# Patient Record
Sex: Male | Born: 1975
Health system: Southern US, Community
[De-identification: ages and names within clinical notes are randomized; demographics above are authoritative.]

## PROBLEM LIST (undated history)

## (undated) DIAGNOSIS — R000 Tachycardia, unspecified: Secondary | ICD-10-CM

## (undated) DIAGNOSIS — I422 Other hypertrophic cardiomyopathy: Secondary | ICD-10-CM

## (undated) DIAGNOSIS — I10 Essential (primary) hypertension: Secondary | ICD-10-CM

## (undated) DIAGNOSIS — I471 Supraventricular tachycardia, unspecified: Secondary | ICD-10-CM

## (undated) HISTORY — DX: Other hypertrophic cardiomyopathy: I42.2

## (undated) HISTORY — DX: Tachycardia, unspecified: R00.0

## (undated) HISTORY — PX: FINGER SURGERY: SHX640

---

## 2008-10-15 ENCOUNTER — Encounter: Payer: Self-pay | Admitting: Internal Medicine

## 2008-10-15 ENCOUNTER — Inpatient Hospital Stay (HOSPITAL_COMMUNITY): Admission: EM | Admit: 2008-10-15 | Discharge: 2008-10-17 | Payer: Self-pay | Admitting: Emergency Medicine

## 2008-10-16 ENCOUNTER — Encounter (INDEPENDENT_AMBULATORY_CARE_PROVIDER_SITE_OTHER): Payer: Self-pay | Admitting: Cardiology

## 2008-10-17 ENCOUNTER — Encounter (INDEPENDENT_AMBULATORY_CARE_PROVIDER_SITE_OTHER): Payer: Self-pay | Admitting: Cardiology

## 2008-10-17 ENCOUNTER — Encounter: Payer: Self-pay | Admitting: Internal Medicine

## 2008-10-17 ENCOUNTER — Encounter: Payer: Self-pay | Admitting: Cardiology

## 2009-11-30 DIAGNOSIS — Z9189 Other specified personal risk factors, not elsewhere classified: Secondary | ICD-10-CM | POA: Insufficient documentation

## 2009-11-30 DIAGNOSIS — E7800 Pure hypercholesterolemia, unspecified: Secondary | ICD-10-CM

## 2009-11-30 DIAGNOSIS — Z8679 Personal history of other diseases of the circulatory system: Secondary | ICD-10-CM

## 2009-11-30 DIAGNOSIS — I429 Cardiomyopathy, unspecified: Secondary | ICD-10-CM | POA: Insufficient documentation

## 2009-12-01 ENCOUNTER — Ambulatory Visit: Payer: Self-pay | Admitting: Internal Medicine

## 2009-12-07 ENCOUNTER — Encounter: Payer: Self-pay | Admitting: Internal Medicine

## 2009-12-15 ENCOUNTER — Ambulatory Visit: Payer: Self-pay | Admitting: Internal Medicine

## 2009-12-15 LAB — CONVERTED CEMR LAB
BUN: 11 mg/dL (ref 6–23)
Basophils Absolute: 0 10*3/uL (ref 0.0–0.1)
Eosinophils Absolute: 0.1 10*3/uL (ref 0.0–0.7)
Eosinophils Relative: 1.3 % (ref 0.0–5.0)
GFR calc non Af Amer: 90.84 mL/min (ref >60)
Glucose, Bld: 97 mg/dL (ref 70–99)
INR: 1 (ref 0.8–1.0)
Lymphocytes Relative: 37 % (ref 12.0–46.0)
Monocytes Relative: 7.3 % (ref 3.0–12.0)
Neutro Abs: 3.2 10*3/uL (ref 1.4–7.7)
Platelets: 288 10*3/uL (ref 150.0–400.0)
Potassium: 4.2 meq/L (ref 3.5–5.1)
RDW: 11.3 % — ABNORMAL LOW (ref 11.5–14.6)
Sodium: 140 meq/L (ref 135–145)
WBC: 5.9 10*3/uL (ref 4.5–10.5)

## 2009-12-22 ENCOUNTER — Ambulatory Visit: Payer: Self-pay | Admitting: Internal Medicine

## 2009-12-22 ENCOUNTER — Ambulatory Visit (HOSPITAL_COMMUNITY): Admission: RE | Admit: 2009-12-22 | Discharge: 2009-12-22 | Payer: Self-pay | Admitting: Internal Medicine

## 2009-12-26 ENCOUNTER — Telehealth: Payer: Self-pay | Admitting: Internal Medicine

## 2009-12-26 ENCOUNTER — Ambulatory Visit: Payer: Self-pay | Admitting: Internal Medicine

## 2009-12-26 ENCOUNTER — Inpatient Hospital Stay (HOSPITAL_COMMUNITY): Admission: EM | Admit: 2009-12-26 | Discharge: 2009-12-28 | Payer: Self-pay | Admitting: Emergency Medicine

## 2009-12-27 ENCOUNTER — Encounter: Payer: Self-pay | Admitting: Internal Medicine

## 2009-12-28 ENCOUNTER — Encounter: Payer: Self-pay | Admitting: Internal Medicine

## 2010-01-02 ENCOUNTER — Encounter: Payer: Self-pay | Admitting: Internal Medicine

## 2010-01-02 ENCOUNTER — Telehealth: Payer: Self-pay | Admitting: Internal Medicine

## 2010-01-04 ENCOUNTER — Telehealth (INDEPENDENT_AMBULATORY_CARE_PROVIDER_SITE_OTHER): Payer: Self-pay | Admitting: *Deleted

## 2010-01-10 ENCOUNTER — Ambulatory Visit: Payer: Self-pay | Admitting: Internal Medicine

## 2010-03-26 ENCOUNTER — Encounter: Admission: RE | Admit: 2010-03-26 | Discharge: 2010-03-26 | Payer: Self-pay | Admitting: Rheumatology

## 2010-04-03 ENCOUNTER — Telehealth: Payer: Self-pay | Admitting: Internal Medicine

## 2010-04-20 ENCOUNTER — Ambulatory Visit: Payer: Self-pay | Admitting: Internal Medicine

## 2010-05-25 ENCOUNTER — Telehealth: Payer: Self-pay | Admitting: Internal Medicine

## 2010-06-20 ENCOUNTER — Ambulatory Visit: Payer: Self-pay | Admitting: Internal Medicine

## 2010-07-09 ENCOUNTER — Emergency Department (HOSPITAL_COMMUNITY): Admission: EM | Admit: 2010-07-09 | Discharge: 2010-07-09 | Payer: Self-pay | Admitting: Emergency Medicine

## 2010-07-17 ENCOUNTER — Encounter (HOSPITAL_COMMUNITY): Admission: RE | Admit: 2010-07-17 | Discharge: 2010-07-27 | Payer: Self-pay | Admitting: Cardiology

## 2010-11-20 NOTE — Progress Notes (Signed)
Summary: chest burning/heart racing-GT  Phone Note Call from Patient Call back at Home Phone [phone]   Caller: Patient Reason for Call: Talk to Nurse Summary of Call: Gets tight burning sensation with activity in his chest..... ongoing for a month or so, last for a minute or two after he stops. Had it last night and then his heart started racing, request call back  Initial call taken by: Migdalia Dk,  April 03, 2010 1:23 PM  Follow-up for Phone Call        has episodes of burning in chest and pressure when he does any physical activity last only a few seconds and goes away when he stops this is followed hours later wih herat racing.  Goes away a lot quicker than before  Last stress test 12/27//2009 per patient Dr Rush Barer, RN, BSN  April 03, 2010 3:22 P  pt returning call [phone] Follow-up by: Glynda Jaeger,  April 12, 2010 2:20 PM  Additional Follow-up for Phone Call Additional follow up Details #1::        If happening more thanonce in a 48 hour period wear a 48 hour holter monitor if less frequent will wait  If symptoms more with exertion will get regular GXT Additional Follow-up by: Laren Boom, MD, [facility],  April 11, 2010 5:49 PM    Additional Follow-up for Phone Call Additional follow up Details #2::    [facility] for pt to call me back  Dennis Bast, RN, BSN  April 12, 2010 2:10 PM  returning call [phone], Migdalia Dk  April 16, 2010 1:32 PM  pt aware and will get monitor after Dr Ladona Ridgel reviews monitor will decide about GXT Dennis Bast, RN, BSN  April 16, 2010 4:40 PM

## 2010-11-20 NOTE — Assessment & Plan Note (Signed)
Summary: nep/SVT/jml   History of Present Illness: Jake Huff is referred today for evaluation of SVT.  He is a pleasant 35 yo man with a h/o tachypalpitations for several months.  He did not have symptoms as a child or as a teenager. He notes that his episodes start and stop suddenly and have been terminated with adenosine.  He denies syncope but does get dizzy when he experiences SVT.  He has been treated with multiple medications but was intolerant to beta blockers. He has continued to have symptoms on calcium channel blockers.  Current Medications (verified): 1)  Cartia Xt 180 Mg Xr24h-Cap (Diltiazem Hcl Coated Beads) .... Take One Tablet By Mouth Once Daily. 2)  Xanax 0.25 Mg Tabs (Alprazolam) .... Take One Tablet By Mouth Once Daily. 3)  Aspirin 81 Mg Tbec (Aspirin) .... Take One Tablet By Mouth Daily 4)  Fish Oil   Oil (Fish Oil) .... As Needed 5)  Prilosec Otc 20 Mg Tbec (Omeprazole Magnesium) .... As Needed 6)  Multivitamins   Tabs (Multiple Vitamin) .... As Needed  Allergies: 1)  ! * Penicillin  Past History:  Past Medical History: Last updated: 11/30/2009  1. Status post supraventricular tachycardia.   2. Status post atypical chest pain, negative stress Myoview.   3. Glucose intolerance.   4. Hypercholesteremia.   5. History of tobacco abuse.   6. Alcohol abuse.   Family History: Last updated: 11/30/2009 Father:  is alive, he has coronary artery disease Mother:  in good health Siblings:  One brother in good health. Siblings:  One sister has palpitation. Grandfather died of sudden cardiac death in his 34s.   Social History: Last updated: 11/30/2009 Married ,has one child Tobacco Use - Yes.  Alcohol Use - yes Drug Use - no  Review of Systems       All systems reviewed and negative except as noted in the HPI.  Vital Signs:  Patient profile:   35 year old male Height:      74 inches Weight:      194 pounds BMI:     25.00 Pulse rate:   75 / minute BP  sitting:   130 / 82  (left arm)  Vitals Entered By: Laurance Flatten CMA (December 01, 2009 1:43 PM)  Physical Exam  General:  Well developed, well nourished, in no acute distress.  HEENT: normal Neck: supple. No JVD. Carotids 2+ bilaterally no bruits Cor: RRR no rubs, gallops or murmur Lungs: CTA Ab: soft, nontender. nondistended. No HSM. Good bowel sounds Ext: warm. no cyanosis, clubbing or edema Neuro: alert and oriented. Grossly nonfocal. affect pleasant    EKG  Procedure date:  12/01/2009  Findings:      Normal sinus rhythm with rate of:  No pre-excitation.  Impression & Recommendations:  Problem # 1:  SUPRAVENTRICULAR TACHYCARDIA, HX OF (ICD-V12.59) I have discussed the treatment options with the patient. The risks/benfits/goals and expectations of EPS/RFA have been discussed and he will call to schedule ablation. His updated medication list for this problem includes:    Cartia Xt 180 Mg Xr24h-cap (Diltiazem hcl coated beads) .Marland Kitchen... Take one tablet by mouth once daily.    Aspirin 81 Mg Tbec (Aspirin) .Marland Kitchen... Take one tablet by mouth daily  Orders: EKG w/ Interpretation (93000)  Problem # 2:  HYPERCHOLESTEROLEMIA (ICD-272.0) He is instructed to maintain a low sodium diet.

## 2010-11-20 NOTE — Letter (Signed)
Summary: Work [employer], Main Office  [address]   [place], [place] [address]   Phone: [phone]  Fax: [phone]        Today's Date: January 02, 2010    Name of Patient: Jake Huff #[id number]    The above named patient has been out of work and may return January 03, 2010  Please take this into consideration when reviewing the time away from work.   Special Instructions:  [ x ] None     Sincerely yours,   Charolotte Capuchin, RN for  Dr. Lewayne Bunting

## 2010-11-20 NOTE — Letter (Signed)
Summary: [employer] Authorization Notification   [employer] Authorization Notification   Imported By: Roderic Ovens 03/12/2010 15:54:10  _____________________________________________________________________  External Attachment:    Type:   Image     Comment:   External Document

## 2010-11-20 NOTE — Progress Notes (Signed)
Summary: Records request from [facility]  Request for records received from [place]. Request forwarded to [employer]. Wilder Glade  January 04, 2010 2:59 PM

## 2010-11-20 NOTE — Progress Notes (Signed)
Summary: results from heart monitor  Phone Note Call from Patient   Caller: Patient Reason for Call: Talk to Nurse, Lab or Test Results Summary of Call: pt turned in heart monitor last month and hasn't gotten results-pls call [phone] Initial call taken by: Glynda Jaeger,  May 25, 2010 11:20 AM  Follow-up for Phone Call        aware of results.  Will ask Dr Ladona Ridgel if there is anything else we can do.  He says it  has gotten better Discussed with Dr Ladona Ridgel he will see him in office to discuss what is going on with his pains and decide what to do from here Dennis Bast, RN, BSN  June 13, 2010 9:23 AM

## 2010-11-20 NOTE — Assessment & Plan Note (Signed)
Summary: chest discomfort/mt   Visit Type:  Follow-up Primary Naika Noto:  None   History of Present Illness: Mr. Ellen returns today for followup.  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  Post procedure he developed sustained junctional rhythm/tachycardia at rates from 80-120/min.  This eventually resolved.  Since I las saw him he has noted periods of exertional chest discomfort.  He thinks that his heart still beats fast though not like it was prior to ablation.  No syncope. No peripheral edema.  Current Medications (verified): 1)  Xanax 0.25 Mg Tabs (Alprazolam) .... Take One Tablet By Mouth Once Daily. 2)  Aspirin 81 Mg Tbec (Aspirin) .... Take One Tablet By Mouth Daily 3)  Fish Oil   Oil (Fish Oil) .... As Needed 4)  Prilosec Otc 20 Mg Tbec (Omeprazole Magnesium) .... As Needed 5)  Multivitamins   Tabs (Multiple Vitamin) .... As Needed  Allergies: 1)  ! * Penicillin  Past History:  Past Medical History: Last updated: 11/30/2009  1. Status post supraventricular tachycardia.   2. Status post atypical chest pain, negative stress Myoview.   3. Glucose intolerance.   4. Hypercholesteremia.   5. History of tobacco abuse.   6. Alcohol abuse.   Review of Systems       The patient complains of chest pain.  The patient denies syncope, dyspnea on exertion, and peripheral edema.    Vital Signs:  Patient profile:   35 year old male Height:      74 inches Weight:      194 pounds Pulse rate:   73 / minute BP sitting:   128 / 80  (left arm)  Vitals Entered By: Laurance Flatten CMA (June 20, 2010 10:41 AM)  Physical Exam  General:  Well developed, well nourished, in no acute distress.  HEENT: normal Neck: supple. No JVD. Carotids 2+ bilaterally no bruits Cor: RRR no rubs, gallops or murmur Lungs: CTA Ab: soft, nontender. nondistended. No HSM. Good bowel sounds Ext: warm. no cyanosis, clubbing or  edema Neuro: alert and oriented. Grossly nonfocal. affect pleasant    Impression & Recommendations:  Problem # 1:  SUPRAVENTRICULAR TACHYCARDIA, HX OF (ICD-V12.59) No evidence of recurrent SVT.  I have asked him to continue a period of watchful waiting.  His 48 hour holter showed no recurrent SVT. The following medications were removed from the medication list:    Metoprolol Tartrate 25 Mg Tabs (Metoprolol tartrate) .Marland Kitchen... Take one tablet by mouth twice a day His updated medication list for this problem includes:    Aspirin 81 Mg Tbec (Aspirin) .Marland Kitchen... Take one tablet by mouth daily  Problem # 2:  CHEST PAIN, ATYPICAL, HX OF (ICD-V15.89) The etiology is unclear but he is improved.  I have asked him to call if his symptoms worsen.  Patient Instructions: 1)  Your physician recommends that you schedule a follow-up appointment in: 6 months with Dr Ladona Ridgel

## 2010-11-20 NOTE — Progress Notes (Signed)
Summary: fluttering   Phone Note Call from Patient Call back at [facility] Phone [phone]   Caller: Patient Reason for Call: Talk to Nurse Details for Reason: Per pt calling, c/o fluttering consistence. no cp sob.  Initial call taken by: Lorne Skeens,  December 26, 2009 2:53 PM  Follow-up for Phone Call        spoke with pt, since discharge from the hosp, he has had off and on fluttering in his chest. he also c/o extreme fatique. he denies cp, SOB but states he felt alittle faint at the grocery store but feels fine since resting. will discuss with dr Tonianne Fine Deliah Goody, RN  December 26, 2009 3:11 PM  Follow-up by: Deliah Goody, RN,  December 26, 2009 3:09 PM  Additional Follow-up for Phone Call Additional follow up Details #1::        discussed with dr Armandina Iman, when called pt back he states he is [place] for eval. Deliah Goody, RN  December 26, 2009 4:55 PM

## 2010-11-20 NOTE — Letter (Signed)
Summary: ELectrophysiology/Ablation Procedure Instructions  [employer], Main Office  [address]   [place], [place] [address]   Phone: [phone]  Fax: [phone]     Electrophysiology/Ablation Procedure Instructions    You are scheduled for a(n) SVT ablation on 12/22/09 at 7:30am with Dr. Ladona Ridgel  1.  Please come to the Short Stay Center at [facility] at 5:45am on the day of your procedure.  2.  Come prepared to stay overnight.   Please bring your insurance cards and a list of your medications.  3.  Come to the [place] office on 12/15/09 at 10am for lab work.  The lab at [facility] is open from 8:30 AM to 1:30 PM and 2:30 PM to 5:00 PM.  You do not have to be fasting.  4.  Do not have anything to eat or drink after midnight the night before your procedure.  5.   All of your remaining medications may be taken with a small amount of water.  6.  Educational material received:     _____ Ablation   * Occasionally, EP studies and ablations can become lengthy.  Please make your family aware of this before your procedure starts.  Average time ranges from 2-8 hours for EP studies/ablations.  Your physician will locate your family after the procedure with the results.  * If you have any questions after you get home, please call the office at [phone].  Anselm Pancoast

## 2010-11-20 NOTE — Assessment & Plan Note (Signed)
Summary: eph/post ablation   CC:  pt states he is doing alot better but he still has an uncomfortable feeling in chest once an a while.  History of Present Illness: Mr. Soth returns today for followup.  He is a pleasant 35 yo man with a h/o medically refractory SVT who underwent EPS/RFA several weeks ago of AVNRT.  Post procedure he developed sustained junctional rhythm/tachycardia at rates from 80-120/min.  He was initially hospitalized and his symptoms eventually improved.  He has had no significant symptoms over the past week.  No c/p or sob.  Current Medications (verified): 1)  Xanax 0.25 Mg Tabs (Alprazolam) .... Take One Tablet By Mouth Once Daily. 2)  Aspirin 81 Mg Tbec (Aspirin) .... Take One Tablet By Mouth Daily 3)  Fish Oil   Oil (Fish Oil) .... As Needed 4)  Prilosec Otc 20 Mg Tbec (Omeprazole Magnesium) .... As Needed 5)  Multivitamins   Tabs (Multiple Vitamin) .... As Needed 6)  Metoprolol Tartrate 25 Mg Tabs (Metoprolol Tartrate) .... Take One Tablet By Mouth Twice A Day  Allergies: 1)  ! * Penicillin  Past History:  Past Medical History: Last updated: 11/30/2009  1. Status post supraventricular tachycardia.   2. Status post atypical chest pain, negative stress Myoview.   3. Glucose intolerance.   4. Hypercholesteremia.   5. History of tobacco abuse.   6. Alcohol abuse.   Review of Systems  The patient denies chest pain, syncope, dyspnea on exertion, and peripheral edema.    Vital Signs:  Patient profile:   35 year old male Height:      74 inches Weight:      198 pounds BMI:     25.51 Pulse rate:   80 / minute Resp:     12 per minute BP sitting:   111 / 75  (left arm)  Vitals Entered By: Kem Parkinson (January 10, 2010 10:43 AM)  Physical Exam  General:  Well developed, well nourished, in no acute distress.  HEENT: normal Neck: supple. No JVD. Carotids 2+ bilaterally no bruits Cor: RRR no rubs, gallops or murmur Lungs: CTA Ab: soft, nontender.  nondistended. No HSM. Good bowel sounds Ext: warm. no cyanosis, clubbing or edema Neuro: alert and oriented. Grossly nonfocal. affect pleasant    EKG  Procedure date:  01/10/2010  Findings:      Normal sinus rhythm with rate of:    Impression & Recommendations:  Problem # 1:  SUPRAVENTRICULAR TACHYCARDIA, HX OF (ICD-V12.59) He is now doing well after his junctional rhythm has settled down.  At this point it is unclear why he had these symptoms.  I will see him back as needed. I have asked him to stop his metoprolol. The following medications were removed from the medication list:    Cartia Xt 180 Mg Xr24h-cap (Diltiazem hcl coated beads) .Marland Kitchen... Take one tablet by mouth once daily. His updated medication list for this problem includes:    Aspirin 81 Mg Tbec (Aspirin) .Marland Kitchen... Take one tablet by mouth daily    Metoprolol Tartrate 25 Mg Tabs (Metoprolol tartrate) .Marland Kitchen... Take one tablet by mouth twice a day  Problem # 2:  HYPERCHOLESTEROLEMIA (ICD-272.0)

## 2010-11-20 NOTE — Progress Notes (Signed)
Summary: pt needs note to rtn to work  ASK DR GT  Phone Note Call from Patient Call back at [facility] Phone [phone]   Caller: Patient Reason for Call: Talk to Nurse, Talk to Doctor Summary of Call: pt needs the date on his note to return to work changed cause he didn't feel good and he didn't go to work yesterday and today. You can fax it to [id number] claim # [id number]. please make rtn date 01/03/2010 Initial call taken by: Omer Jack,  January 02, 2010 9:21 AM  Follow-up for Phone Call        [facility] per dr Ladona Ridgel.  Letter will be faxed as requested Follow-up by: Charolotte Capuchin, RN,  January 02, 2010 12:20 PM

## 2010-11-20 NOTE — Letter (Signed)
Summary: FMLA Papers  FMLA Papers   Imported By: Kassie Mends 03/05/2010 09:12:11  _____________________________________________________________________  External Attachment:    Type:   Image     Comment:   External Document

## 2011-01-03 LAB — LIPID PANEL
Cholesterol: 189 mg/dL (ref 0–200)
LDL Cholesterol: 106 mg/dL — ABNORMAL HIGH (ref 0–99)
Total CHOL/HDL Ratio: 5 RATIO
Triglycerides: 226 mg/dL — ABNORMAL HIGH (ref <150)
VLDL: 45 mg/dL — ABNORMAL HIGH (ref 0–40)

## 2011-01-03 LAB — COMPREHENSIVE METABOLIC PANEL
AST: 26 U/L (ref 0–37)
Albumin: 4.4 g/dL (ref 3.5–5.2)
BUN: 8 mg/dL (ref 6–23)
Calcium: 9.7 mg/dL (ref 8.4–10.5)
Chloride: 104 mEq/L (ref 96–112)
Creatinine, Ser: 0.9 mg/dL (ref 0.4–1.5)
GFR calc non Af Amer: >60 mL/min (ref >60)
Total Protein: 8.1 g/dL (ref 6.0–8.3)

## 2011-01-03 LAB — HEPATIC FUNCTION PANEL
AST: 24 U/L (ref 0–37)
Albumin: 3.9 g/dL (ref 3.5–5.2)
Bilirubin, Direct: 0.3 mg/dL (ref 0.0–0.3)
Indirect Bilirubin: 0.6 mg/dL (ref 0.3–0.9)

## 2011-01-03 LAB — BASIC METABOLIC PANEL
Calcium: 9.2 mg/dL (ref 8.4–10.5)
GFR calc Af Amer: >60 mL/min (ref >60)
GFR calc non Af Amer: >60 mL/min (ref >60)
Glucose, Bld: 93 mg/dL (ref 70–99)

## 2011-01-03 LAB — DIFFERENTIAL
Basophils Absolute: 0 10*3/uL (ref 0.0–0.1)
Basophils Relative: 0 % (ref 0–1)
Eosinophils Relative: 0 % (ref 0–5)

## 2011-01-03 LAB — POCT CARDIAC MARKERS
Myoglobin, poc: 68.2 ng/mL (ref 12–200)
Troponin i, poc: <0.05 ng/mL (ref 0.00–0.09)

## 2011-01-03 LAB — CBC
MCH: 31.5 pg (ref 26.0–34.0)
MCHC: 34.7 g/dL (ref 30.0–36.0)
Platelets: 281 10*3/uL (ref 150–400)

## 2011-01-03 LAB — PROTIME-INR: INR: 0.94 (ref 0.00–1.49)

## 2011-01-14 LAB — CARDIAC PANEL(CRET KIN+CKTOT+MB+TROPI)
CK, MB: 0.6 ng/mL (ref 0.3–4.0)
Relative Index: INVALID (ref 0.0–2.5)
Relative Index: INVALID (ref 0.0–2.5)
Total CK: 51 U/L (ref 7–232)
Troponin I: 0.02 ng/mL (ref 0.00–0.06)

## 2011-01-14 LAB — LIPID PANEL
Cholesterol: 167 mg/dL (ref 0–200)
Total CHOL/HDL Ratio: 5.1 RATIO

## 2011-01-14 LAB — BRAIN NATRIURETIC PEPTIDE: Pro B Natriuretic peptide (BNP): 64 pg/mL (ref 0.0–100.0)

## 2011-01-14 LAB — POCT I-STAT, CHEM 8
BUN: 13 mg/dL (ref 6–23)
HCT: 53 % — ABNORMAL HIGH (ref 39.0–52.0)

## 2011-01-14 LAB — MAGNESIUM: Magnesium: 2.2 mg/dL (ref 1.5–2.5)

## 2011-03-05 NOTE — Assessment & Plan Note (Signed)
[facility]                                 ON-CALL NOTE   HIKEEM, ANDERSSON                        MRN:          [id number]  DATE:12/24/2009                            DOB:          April 05, 1976    The phone conversation was December 24, 2009.  Mr. Monks had the on-call PA  paged due to experiencing heart fluttering after having radiofrequency  ablation for supraventricular tachycardia on December 22, 2009.  The patient  was discharged from the hospital that same day and shortly thereafter  began to experience unpleasant heart fluttering that he was concerned  might represent a recurrence of his SVT.  I informed the patient that I  would try to move up his follow up appointment with Dr. Ladona Ridgel that is  already scheduled for January 04, 2010, and the patient was agreeable to  this.  I followed up with the phone conversation on Sunday December 25, 2009, and the patient indicated that his fluttering had been less severe  but was still interested in moving up his appointment if possible but if  not, he was okay with the appointment on the 17th.  I will attempt to  move up this appointment pending Dr. Lubertha Basque availability.     Jarrett Ables, [facility]     MS/MedQ  DD: 12/24/2009  DT: 12/25/2009  Job #: [id number]

## 2011-03-05 NOTE — Discharge Summary (Signed)
NAMEXERXES, AGRUSA               ACCOUNT NO.:  [id number]   MEDICAL RECORD NO.:  [id number]          PATIENT TYPE:  INP   LOCATION:  3701                         FACILITY:  [facility]   PHYSICIAN:  Eduardo Osier. Sharyn Lull, M.D. DATE OF BIRTH:  08-31-1976   DATE OF ADMISSION:  10/16/2008  DATE OF DISCHARGE:  10/17/2008                               DISCHARGE SUMMARY   ADMITTING DIAGNOSES:  1. Status post recurrent supraventricular tachycardia.  2. Atypical chest pain, rule out myocardial infarction.  3. Elevated blood sugar, rule out diabetes mellitus.  4. Tobacco abuse.  5. Alcohol abuse.  6. Ankylosing spondylosis.  7. History of iritis in the past.   DISCHARGE DIAGNOSES:  1. Status post supraventricular tachycardia.  2. Status post atypical chest pain, negative stress Myoview.  3. Glucose intolerance.  4. Hypercholesteremia.  5. History of tobacco abuse.  6. Alcohol abuse.  7. Anxiety disorder.  8. History of ankylosing spondylosis.  9. History of iritis in the past.   DISCHARGE HOME MEDICATIONS:  1. Toprol-XL 50 mg 1 tablet daily.  2. Xanax 0.25 mg 1 tablet twice daily.  3. Enteric-coated aspirin 81 mg 1 tablet daily.   DIET:  Low-salt, low-cholesterol, 1800-calorie ADA diet.  The patient  has been advised to avoid sweets.   ACTIVITY:  As tolerated.   Follow up with me in 2 weeks.   Condition at discharge is stable.   BRIEF HISTORY AND HOSPITAL COURSE:  Mr. Picklesimer is a 35 year old white  male with past medical history significant for ankylosing spondylosis,  history of iritis, tobacco abuse, alcohol abuse, history of occasional  marijuana abuse in the past, complains of palpitations associated with  retrosternal chest discomfort and mild shortness of breath, lasting more  than 30 minutes and the patient was noted to be in SVT.  The patient  received 6 mg plus 12 mg of IV adenosine with conversion to sinus  rhythm.  The patient denies any drug abuse recently.  Denies  cocaine  abuse.  States that multiple episodes of palpitation in the past and was  told to have anxiety attack.  Denies any syncopal episode.  States prior  episodes lasted a few seconds to minute.  States he had 4 beers last  night.  Denies any viral symptoms.  Denies any thyroid problem.   PAST MEDICAL HISTORY:  As above.   PAST SURGICAL HISTORY:  He had left hand finger surgery in the past.   MEDICATIONS:  None.   ALLERGIES:  He is allergic to PENICILLIN.   SOCIAL HISTORY:  He is married, has 1 child.  Smoked half pack per day  for 12 years.  Drinks beer and whiskey socially and occasionally smoked  marijuana in the past.  No history of cocaine abuse.  No IVDA.   FAMILY HISTORY:  Father is alive, he has coronary artery disease.  Mother in good health.  One brother in good health.  One sister has  palpitation.  Grandfather died of sudden cardiac death in his 57s.   PHYSICAL EXAMINATION:  GENERAL:  He was alert, awake, and oriented x3 in  no acute distress.  VITAL SIGNS:  Blood pressure was 135/76, pulse was 111, sinus tach on  the monitor post conversion from SVT.  HEENT:  Conjunctivae pink.  NECK:  Supple.  No JVD, no bruit.  LUNGS:  Clear to auscultation without rhonchi or rales.  CARDIOVASCULAR:  S1 and S2 was normal.  There was soft systolic murmur.  There was no S3 gallop, no click, no S4 gallop.  ABDOMEN:  Soft.  Bowel sounds were present, nontender.  EXTREMITIES:  There is no clubbing, cyanosis, or edema.   EKG showed sinus tach, early repolarization changes, nonspecific ST-T  wave changes, and nondiagnostic Q-waves in inferolateral leads.   LABORATORY FINDINGS:  Urine drug screen was negative.  Point-of-care  cardiac markers, CK-MB was 1.3, troponin I was 0.05, myoglobin was 78.  Repeat cardiac panel, his troponin I was slightly elevated 0.10, CPK was  107, MB 2.0.  Repeat cardiac markers, Troponin I was 0.08.  CPK was 84,  MB 1.4.  Third set, CK 70, MB 1.3,  troponin I 0.05.  His hemoglobin was  15.9, hematocrit 46.3, white count of 9.9.  Potassium was 3.4, glucose  was 174.  Repeat fasting glucose this morning is 93, BUN 15, creatinine  1.1, magnesium was normal 2.1.  His TSH was normal 1.10.  Hemoglobin A1c  was 5.4.  Lipid panel, cholesterol 165, LDL 129, HDL 33.   BRIEF HOSPITAL COURSE:  The patient was admitted to Telemetry Unit.  The  patient had vague chest pain during the hospital stay and was noted to  have minimally elevated troponin I.  The patient did not have any  episodes of SVT during the hospital stay.  The patient was started on IV  beta-blockers with control of his heart rhythm and rate.  The patient  subsequently underwent stress Myoview today, exercised for 12 minutes on  Bruce protocol without any EKG changes or any arrhythmias.  Myoview scan  showed no evidence of reversible ischemia with the  EF of 66%.  Discussed with the patient at length regarding medical  management versus radiofrequency ablation.  The patient opted for  medical management for now.  If he gets recurrent episodes of SVT, we  will consider invasive route.      Eduardo Osier. Sharyn Lull, M.D.  Electronically Signed     MNH/MEDQ  D:  10/17/2008  T:  10/18/2008  Job:  [id number]

## 2011-04-17 ENCOUNTER — Other Ambulatory Visit (HOSPITAL_COMMUNITY): Payer: Self-pay | Admitting: Cardiology

## 2011-04-25 ENCOUNTER — Telehealth: Payer: Self-pay | Admitting: Internal Medicine

## 2011-04-25 NOTE — Telephone Encounter (Signed)
Per pt calling c/o chestpain.

## 2011-04-25 NOTE — Telephone Encounter (Signed)
Jake Huff is a patient of Dr. Sharyn Lull and has been experiencing chest discomfort for the past few days. He has a cath planned for tomorrow and wanted to ask [place] a few questions. His office has already set this up.

## 2011-04-26 ENCOUNTER — Ambulatory Visit (HOSPITAL_COMMUNITY)
Admission: RE | Admit: 2011-04-26 | Discharge: 2011-04-26 | Disposition: A | Discharge status: Home / Self Care | Payer: BC Managed Care – PPO | Source: Ambulatory Visit | Attending: Cardiology | Admitting: Cardiology

## 2011-04-26 DIAGNOSIS — R079 Chest pain, unspecified: Secondary | ICD-10-CM | POA: Insufficient documentation

## 2011-04-26 DIAGNOSIS — E739 Lactose intolerance, unspecified: Secondary | ICD-10-CM | POA: Insufficient documentation

## 2011-04-26 DIAGNOSIS — Z7982 Long term (current) use of aspirin: Secondary | ICD-10-CM | POA: Insufficient documentation

## 2011-04-26 DIAGNOSIS — E7800 Pure hypercholesterolemia, unspecified: Secondary | ICD-10-CM | POA: Insufficient documentation

## 2011-04-26 DIAGNOSIS — M459 Ankylosing spondylitis of unspecified sites in spine: Secondary | ICD-10-CM | POA: Insufficient documentation

## 2011-04-26 DIAGNOSIS — Z87891 Personal history of nicotine dependence: Secondary | ICD-10-CM | POA: Insufficient documentation

## 2011-04-29 ENCOUNTER — Telehealth: Payer: Self-pay | Admitting: Internal Medicine

## 2011-04-29 NOTE — Telephone Encounter (Addendum)
After receiving ROI from Mr. Dinan via fax, I faxed his OV, EKG, Stress, CXR, Cath & Echo. to Dr. Cyndy Freeze @ [facility] ([phone]).

## 2011-04-29 NOTE — Telephone Encounter (Signed)
Patient has been getting very lethargic with activity. He also experiences some chest discomfort with activity. He had a cardiac catheterization with Dr. Sharyn Lull 04/26/11 which was normal. He would like for Dr. Ladona Ridgel to advise him on the symptoms he has been having. He has a history of SVT with an ablation in March of 2011 and hasn't felt right since then. He is feeling the same he felt when he was in SVT but denies palpitations. He would like to wear a heart monitor. I did advise him that we could bring him in for an EKG. These symptoms have been present for the past few weeks. Will route to Dr. Ladona Ridgel to review.

## 2011-04-29 NOTE — Telephone Encounter (Signed)
Pt calling re problems with feeling faint and weak when he does physical activity, test last week normal, wants to know what to do?

## 2011-04-30 ENCOUNTER — Telehealth: Payer: Self-pay | Admitting: Internal Medicine

## 2011-04-30 NOTE — Telephone Encounter (Signed)
Opp Note from 12/2009 & Ekg faxed to [facility] @ [phone] per Tresa Endo  04/30/11/lkm

## 2011-05-07 ENCOUNTER — Other Ambulatory Visit (HOSPITAL_COMMUNITY): Payer: Self-pay

## 2011-05-08 NOTE — Cardiovascular Report (Signed)
Jake Huff, Jake Huff NO.:  [id number]  MEDICAL RECORD NO.:  [id number]  LOCATION:  [facility]                         FACILITY:  [facility]  PHYSICIAN:  Eduardo Osier. Sharyn Lull, M.D. DATE OF BIRTH:  [birth year]  DATE OF PROCEDURE:  04/26/2011 DATE OF DISCHARGE:                           CARDIAC CATHETERIZATION   PROCEDURE:  Left cardiac cath with selective left and right coronary angiography, left ventriculography via right groin using Judkins technique.  INDICATIONS FOR PROCEDURE:  Jake Huff is 35 year old white male with past medical history significant for SVT, status post AV nodal reentrant tachycardia ablation in March 2001, hypercholesteremia, glucose intolerance, history of tobacco abuse history of ankylosing spondylosis. He came to the office complaining of recurrent retrosternal chest pressure/tightness associated with minimal exertion associated with feeling weak and tired, states for last 3 days, gets chest pain with walking uphill associated with diaphoresis, nausea and neck pain and he feels likely will pass out if he continues to exert.  Denies any palpitation or syncopal episode.  Denies relation of chest pain to food, breathing, or movement.  States has started taking Toprol-XL again and aspirin with relief of chest pain.  Denies rest or nocturnal angina.  PAST MEDICAL HISTORY:  As above.  PAST SURGICAL HISTORY:  He had left hand finger surgery in the past, also had AV nodal reentrant tachycardia ablation in the past.  ALLERGIES:  He is allergic to PENICILLIN.  MEDICATIONS AT HOME:  He is on enteric-coated aspirin 81 mg p.o. daily, Toprol-XL 25 mg daily, which he restarted recently.  The patient also was given prescription for Nitrostat 0.4 mg sublingual q.5 minutes and Effient 10 mg p.o. daily.  SOCIAL HISTORY:  He is married, has one child.  Smoked half pack per day for 12 years.  Drinks occasionally socially.  No history of drug abuse.  FAMILY  HISTORY:  Father is alive.  He has coronary artery disease. Mother is alive in good health.  One sister had palpitation.  His grandfather died of sudden cardiac death in his 37s.  PHYSICAL EXAMINATION:  GENERAL:  He was alert, awake and oriented x3 in no acute distress. VITAL SIGNS:  Blood pressure was 130/70, pulse was 70, regular. HEENT:  Conjunctivae was pink. NECK:  Supple.  No JVD.  No bruit. LUNGS:  Clear to auscultation without rhonchi or rales. CARDIOVASCULAR:  S1 and S2 was normal.  There was soft systolic murmur. There was no pericardial rub. ABDOMEN:  Soft.  Bowel sounds were present, nontender. EXTREMITIES:  There was no clubbing, cyanosis, or edema.  Due to recurrent typical anginal chest pain, discussed with the patient regarding left cath, its risks and benefits i.e. death, MI, stroke, need for emergency CABG, local vascular complications, etc. and consented for procedure.  PROCEDURE:  After obtaining the informed consent, the patient was brought to the Cath Lab and was placed on fluoroscopy table.  Right groin was prepped and draped in usual fashion.  Xylocaine 1% was used for local anesthesia in the right groin.  With the help of thin-wall needle, 5-French arterial sheath was placed.  The sheath was aspirated and flushed.  Next, a 5-French left Judkins catheter was advanced over the  wire under fluoroscopic guidance up to the ascending aorta.  Wire was pulled out, the catheter was aspirated, and connected to the manifold.  Catheter was further advanced and engaged into left coronary ostium.  Multiple views of the left system were taken.  Next, the catheter was disengaged and was pulled out over the wire and was replaced with 5-French right Judkins catheter, which was advanced over the wire under fluoroscopic guidance up to the ascending aorta.  Wire was pulled out, the catheter was aspirated and connected to the manifold.  Catheter was further advanced and engaged  into right coronary ostium.  Multiple views of the right system were taken.  Next, the catheter was disengaged and was pulled out over the wire and was replaced with 5-French pigtail catheter, which was advanced over the wire under fluoroscopic guidance up to the ascending aorta.  The wire was pulled out, the catheter was aspirated and connected to the manifold.  Catheter was further advanced across the aortic valve into the LV.  LV pressures were recorded.  Next, left ventriculography was done in 30-degree RAO position.  Post-angiographic pressures were recorded from LV and then pullback pressures were recorded from the aorta.  There was no gradient across the aortic valve.  Next, the pigtail catheter was pulled out over the wire.  Sheaths were aspirated and flushed.  FINDINGS:  LV showed good LV systolic function, mild LVH, EF of 55-60%. Left main was long, which was patent.  LAD was patent.  Diagonal 1 and 2 were small, which were patent.  Ramus was small, which was patent.  Left circumflex was large, which was patent.  OM-1 to OM-4 were patent.  RCA was patent.  PDA was small, which was patent.  PLV branches were small, which were patent.  The patient has codominant coronary system.  The patient tolerated the procedure well.  There were no complications.  The patient was transferred to recovery room in stable condition.     Eduardo Osier. Sharyn Lull, M.D.     [facility]MED[facility]  D:  04/26/2011  T:  04/27/2011  Job:  [id number]  Electronically Signed by Rinaldo Cloud M.D. on 05/08/2011 11:14:58 PM

## 2011-07-26 LAB — CBC
HCT: 43.1 % (ref 39.0–52.0)
HCT: 43.5 % (ref 39.0–52.0)
HCT: 46.2 % (ref 39.0–52.0)
HCT: 47.4 % (ref 39.0–52.0)
Hemoglobin: 14.8 g/dL (ref 13.0–17.0)
Hemoglobin: 15.3 g/dL (ref 13.0–17.0)
Hemoglobin: 15.9 g/dL (ref 13.0–17.0)
MCHC: 34 g/dL (ref 30.0–36.0)
MCHC: 34.3 g/dL (ref 30.0–36.0)
MCV: 93.6 fL (ref 78.0–100.0)
MCV: 93.6 fL (ref 78.0–100.0)
MCV: 94.6 fL (ref 78.0–100.0)
MCV: 94.7 fL (ref 78.0–100.0)
Platelets: 320 10*3/uL (ref 150–400)
Platelets: 337 10*3/uL (ref 150–400)
Platelets: 342 10*3/uL (ref 150–400)
RBC: 4.97 MIL/uL (ref 4.22–5.81)
RDW: 12.7 % (ref 11.5–15.5)
RDW: 12.9 % (ref 11.5–15.5)
RDW: 13.1 % (ref 11.5–15.5)
RDW: 13.2 % (ref 11.5–15.5)
WBC: 11.3 10*3/uL — ABNORMAL HIGH (ref 4.0–10.5)
WBC: 9.9 10*3/uL (ref 4.0–10.5)

## 2011-07-26 LAB — RAPID URINE DRUG SCREEN, HOSP PERFORMED
Benzodiazepines: NOT DETECTED
Tetrahydrocannabinol: NOT DETECTED

## 2011-07-26 LAB — LIPID PANEL
HDL: 33 mg/dL — ABNORMAL LOW (ref >39)
HDL: 34 mg/dL — ABNORMAL LOW (ref >39)
Triglycerides: 129 mg/dL (ref <150)
Triglycerides: 151 mg/dL — ABNORMAL HIGH (ref <150)
VLDL: 30 mg/dL (ref 0–40)

## 2011-07-26 LAB — URINALYSIS, ROUTINE W REFLEX MICROSCOPIC
Glucose, UA: NEGATIVE mg/dL
Protein, ur: NEGATIVE mg/dL

## 2011-07-26 LAB — HEMOGLOBIN A1C
Hgb A1c MFr Bld: 5.3 % (ref 4.6–6.1)
Hgb A1c MFr Bld: 5.4 % (ref 4.6–6.1)
Mean Plasma Glucose: 105 mg/dL
Mean Plasma Glucose: 108 mg/dL

## 2011-07-26 LAB — GLUCOSE, CAPILLARY
Glucose-Capillary: 101 mg/dL — ABNORMAL HIGH (ref 70–99)
Glucose-Capillary: 106 mg/dL — ABNORMAL HIGH (ref 70–99)
Glucose-Capillary: 113 mg/dL — ABNORMAL HIGH (ref 70–99)
Glucose-Capillary: 128 mg/dL — ABNORMAL HIGH (ref 70–99)

## 2011-07-26 LAB — POCT CARDIAC MARKERS
CKMB, poc: 1.3 ng/mL (ref 1.0–8.0)
Myoglobin, poc: 78.2 ng/mL (ref 12–200)
Troponin i, poc: <0.05 ng/mL (ref 0.00–0.09)

## 2011-07-26 LAB — CARDIAC PANEL(CRET KIN+CKTOT+MB+TROPI)
CK, MB: 0.7 ng/mL (ref 0.3–4.0)
CK, MB: 1.1 ng/mL (ref 0.3–4.0)
CK, MB: 1.4 ng/mL (ref 0.3–4.0)
Relative Index: INVALID (ref 0.0–2.5)
Total CK: 54 U/L (ref 7–232)
Total CK: 56 U/L (ref 7–232)
Total CK: 66 U/L (ref 7–232)
Total CK: 70 U/L (ref 7–232)
Total CK: 81 U/L (ref 7–232)
Troponin I: 0.01 ng/mL (ref 0.00–0.06)
Troponin I: 0.02 ng/mL (ref 0.00–0.06)
Troponin I: 0.1 ng/mL — ABNORMAL HIGH (ref 0.00–0.06)

## 2011-07-26 LAB — BASIC METABOLIC PANEL
BUN: 9 mg/dL (ref 6–23)
CO2: 22 mEq/L (ref 19–32)
CO2: 27 mEq/L (ref 19–32)
Calcium: 9.9 mg/dL (ref 8.4–10.5)
Chloride: 106 mEq/L (ref 96–112)
Chloride: 106 mEq/L (ref 96–112)
Creatinine, Ser: 0.82 mg/dL (ref 0.4–1.5)
GFR calc Af Amer: >60 mL/min (ref >60)
GFR calc non Af Amer: >60 mL/min (ref >60)
GFR calc non Af Amer: >60 mL/min (ref >60)
Glucose, Bld: 105 mg/dL — ABNORMAL HIGH (ref 70–99)
Glucose, Bld: 93 mg/dL (ref 70–99)
Potassium: 3.6 mEq/L (ref 3.5–5.1)
Potassium: 3.8 mEq/L (ref 3.5–5.1)
Sodium: 140 mEq/L (ref 135–145)
Sodium: 142 mEq/L (ref 135–145)

## 2011-07-26 LAB — COMPREHENSIVE METABOLIC PANEL
AST: 23 U/L (ref 0–37)
Albumin: 3.8 g/dL (ref 3.5–5.2)
Albumin: 4 g/dL (ref 3.5–5.2)
Alkaline Phosphatase: 62 U/L (ref 39–117)
BUN: 9 mg/dL (ref 6–23)
BUN: 9 mg/dL (ref 6–23)
Calcium: 9.6 mg/dL (ref 8.4–10.5)
Chloride: 106 mEq/L (ref 96–112)
Creatinine, Ser: 0.74 mg/dL (ref 0.4–1.5)
Creatinine, Ser: 0.85 mg/dL (ref 0.4–1.5)
GFR calc Af Amer: >60 mL/min (ref >60)
Glucose, Bld: 157 mg/dL — ABNORMAL HIGH (ref 70–99)
Total Bilirubin: 0.7 mg/dL (ref 0.3–1.2)
Total Protein: 6.5 g/dL (ref 6.0–8.3)
Total Protein: 6.8 g/dL (ref 6.0–8.3)

## 2011-07-26 LAB — DIFFERENTIAL
Lymphocytes Relative: 24 % (ref 12–46)
Lymphs Abs: 2.3 10*3/uL (ref 0.7–4.0)
Monocytes Absolute: 0.4 10*3/uL (ref 0.1–1.0)
Monocytes Relative: 4 % (ref 3–12)
Neutro Abs: 7 10*3/uL (ref 1.7–7.7)

## 2011-07-26 LAB — TSH: TSH: 0.751 u[IU]/mL (ref 0.350–4.500)

## 2011-07-26 LAB — MAGNESIUM: Magnesium: 2.1 mg/dL (ref 1.5–2.5)

## 2011-07-26 LAB — APTT: aPTT: 30 seconds (ref 24–37)

## 2011-07-26 LAB — PROTIME-INR
INR: 1 (ref 0.00–1.49)
Prothrombin Time: 12.8 seconds (ref 11.6–15.2)

## 2011-12-06 ENCOUNTER — Ambulatory Visit (INDEPENDENT_AMBULATORY_CARE_PROVIDER_SITE_OTHER): Payer: BC Managed Care – PPO | Admitting: Physician Assistant

## 2011-12-06 VITALS — BP 119/75 | HR 61 | Temp 98.5°F | Resp 18 | Ht 72.75 in | Wt 188.0 lb

## 2011-12-06 DIAGNOSIS — R21 Rash and other nonspecific skin eruption: Secondary | ICD-10-CM

## 2011-12-06 LAB — POCT CBC
Granulocyte percent: 59.3 %G (ref 37–80)
HCT, POC: 49.2 % (ref 43.5–53.7)
Hemoglobin: 15.9 g/dL (ref 14.1–18.1)
POC Granulocyte: 3.7 (ref 2–6.9)

## 2011-12-06 LAB — POCT SEDIMENTATION RATE: POCT SED RATE: 12 mm/hr (ref 0–22)

## 2011-12-06 LAB — COMPREHENSIVE METABOLIC PANEL
ALT: 15 U/L (ref 0–53)
CO2: 27 mEq/L (ref 19–32)
Calcium: 9.8 mg/dL (ref 8.4–10.5)
Chloride: 101 mEq/L (ref 96–112)
Potassium: 4.5 mEq/L (ref 3.5–5.3)
Sodium: 138 mEq/L (ref 135–145)
Total Protein: 7.9 g/dL (ref 6.0–8.3)

## 2011-12-06 NOTE — Patient Instructions (Signed)
Take Zyrtec once a day for a week. Fill prednisone if symptoms worsen. Watch for swelling in mouth or lesions/ulcers in mouth.

## 2011-12-09 ENCOUNTER — Telehealth: Payer: Self-pay

## 2011-12-09 MED ORDER — PREDNISONE 20 MG PO TABS: ORAL_TABLET | ORAL | Status: DC

## 2011-12-09 NOTE — Progress Notes (Signed)
Subjective:    Patient ID: Jake Huff, male    DOB: [birth year], 36 y.o.   MRN: [id number]  HPI Clebert presents with a rash on both arms and now face for ~ 1 week.  Denies new meds.  Did take one dose of Indocin for his back pain but that was several weeks prior to onset of rash.  No new lotions, creams or topicals.  New laundry months ago.  Irritating rash with some itching.  Denies prolonged sun exposure or illness.  Ankylosing Spondylitis   No recent f/u but no new issues  Review of Systems  Constitutional: Negative for fever and chills.  HENT: Negative for congestion and sore throat.   Eyes: Negative for redness.  Respiratory: Negative for cough.   Gastrointestinal: Negative for nausea and vomiting.  Musculoskeletal: Negative for myalgias.  Skin: Positive for rash.  Neurological: Negative for headaches.       Objective:   Physical Exam  HENT:  Mouth/Throat: Oropharynx is clear and moist. No oral lesions.  Skin: Lesion and rash noted. Rash is papular. There is erythema.       Bilateral arms and face with papular red rash. Some noted upper chest/base of anterior neck. Palms/soles spared.   Results for orders placed in visit on 12/06/11  POCT CBC      Component Value Range   WBC 6.2  4.6 - 10.2 (K/uL)   Lymph, poc 2.2  0.6 - 3.4    POC LYMPH PERCENT 35.0  10 - 50 (%L)   MID (cbc) 0.4  0 - 0.9    POC MID % 5.7  0 - 12 (%M)   POC Granulocyte 3.7  2 - 6.9    Granulocyte percent 59.3  37 - 80 (%G)   RBC 5.23  4.69 - 6.13 (M/uL)   Hemoglobin 15.9  14.1 - 18.1 (g/dL)   HCT, POC 40.9  81.1 - 53.7 (%)   MCV 94.0  80 - 97 (fL)   MCH, POC 30.4  27 - 31.2 (pg)   MCHC 32.3  31.8 - 35.4 (g/dL)   RDW, POC 91.4     Platelet Count, POC 387  142 - 424 (K/uL)   MPV 7.2  0 - 99.8 (fL)  POCT SEDIMENTATION RATE      Component Value Range   POCT SED RATE 12  0 - 22 (mm/hr)  COMPREHENSIVE METABOLIC PANEL      Component Value Range   Sodium 138  135 - 145 (mEq/L)   Potassium 4.5  3.5  - 5.3 (mEq/L)   Chloride 101  96 - 112 (mEq/L)   CO2 27  19 - 32 (mEq/L)   Glucose, Bld 96  70 - 99 (mg/dL)   BUN 14  6 - 23 (mg/dL)   Creat 7.82  9.56 - 2.13 (mg/dL)   Total Bilirubin 0.8  0.3 - 1.2 (mg/dL)   Alkaline Phosphatase 64  39 - 117 (U/L)   AST 18  0 - 37 (U/L)   ALT 15  0 - 53 (U/L)   Total Protein 7.9  6.0 - 8.3 (g/dL)   Albumin 4.8  3.5 - 5.2 (g/dL)   Calcium 9.8  8.4 - 08.6 (mg/dL)        Assessment & Plan:  Rash Ankylosing spondylitis  Likely contact rash but check CMET and Sed Rate since pt has history of Ankylosing Spondylitis. Trial of daily Zyrtec initially.  If no relief add Prednisone taper. Return if no improvement or if  symptoms worsen.

## 2011-12-09 NOTE — Telephone Encounter (Signed)
.  [facility]    PT STATES WE WERE TO CALL IN PREDNISONE RX TO [facility]   BEST PHONE [phone]   [facility] [place]

## 2011-12-09 NOTE — Telephone Encounter (Signed)
I sent this in today.  Not sure what happened.

## 2011-12-10 NOTE — Telephone Encounter (Signed)
Spoke with pt and stated he was able to p/u rx last night.

## 2017-01-28 ENCOUNTER — Other Ambulatory Visit: Payer: Self-pay | Admitting: Gastroenterology

## 2017-01-28 DIAGNOSIS — R079 Chest pain, unspecified: Secondary | ICD-10-CM

## 2017-01-28 DIAGNOSIS — K219 Gastro-esophageal reflux disease without esophagitis: Secondary | ICD-10-CM

## 2017-02-07 ENCOUNTER — Ambulatory Visit (HOSPITAL_COMMUNITY): Payer: 59

## 2017-02-07 ENCOUNTER — Encounter (HOSPITAL_COMMUNITY): Payer: 59

## 2017-02-19 ENCOUNTER — Encounter (HOSPITAL_COMMUNITY): Payer: Self-pay

## 2017-02-19 ENCOUNTER — Other Ambulatory Visit (HOSPITAL_COMMUNITY): Payer: Self-pay

## 2017-02-19 ENCOUNTER — Encounter (HOSPITAL_COMMUNITY): Payer: 59

## 2017-03-27 ENCOUNTER — Encounter (HOSPITAL_COMMUNITY)
Admission: RE | Admit: 2017-03-27 | Discharge: 2017-03-27 | Disposition: A | Discharge status: Home / Self Care | Payer: 59 | Source: Ambulatory Visit | Attending: Gastroenterology | Admitting: Gastroenterology

## 2017-03-27 ENCOUNTER — Ambulatory Visit (HOSPITAL_COMMUNITY)
Admission: RE | Admit: 2017-03-27 | Discharge: 2017-03-27 | Disposition: A | Discharge status: Home / Self Care | Payer: 59 | Source: Ambulatory Visit | Attending: Gastroenterology | Admitting: Gastroenterology

## 2017-03-27 DIAGNOSIS — R079 Chest pain, unspecified: Secondary | ICD-10-CM | POA: Insufficient documentation

## 2017-03-27 DIAGNOSIS — K219 Gastro-esophageal reflux disease without esophagitis: Secondary | ICD-10-CM

## 2017-03-27 MED ORDER — TECHNETIUM TC 99M MEBROFENIN IV KIT
5.0000 | PACK | Freq: Once | INTRAVENOUS | Status: AC | PRN
  Administered 2017-03-27: 5 via INTRAVENOUS

## 2018-05-09 ENCOUNTER — Other Ambulatory Visit: Payer: Self-pay

## 2018-05-09 ENCOUNTER — Emergency Department (HOSPITAL_COMMUNITY): Payer: BLUE CROSS/BLUE SHIELD

## 2018-05-09 ENCOUNTER — Encounter (HOSPITAL_COMMUNITY): Payer: Self-pay | Admitting: Emergency Medicine

## 2018-05-09 ENCOUNTER — Emergency Department (HOSPITAL_COMMUNITY)
Admission: EM | Admit: 2018-05-09 | Discharge: 2018-05-09 | Disposition: A | Discharge status: Home / Self Care | Payer: BLUE CROSS/BLUE SHIELD | Attending: Emergency Medicine | Admitting: Emergency Medicine

## 2018-05-09 DIAGNOSIS — R5383 Other fatigue: Secondary | ICD-10-CM | POA: Diagnosis not present

## 2018-05-09 DIAGNOSIS — R400 Somnolence: Secondary | ICD-10-CM | POA: Diagnosis not present

## 2018-05-09 DIAGNOSIS — Z87891 Personal history of nicotine dependence: Secondary | ICD-10-CM | POA: Diagnosis not present

## 2018-05-09 DIAGNOSIS — R072 Precordial pain: Secondary | ICD-10-CM | POA: Diagnosis not present

## 2018-05-09 DIAGNOSIS — R002 Palpitations: Secondary | ICD-10-CM | POA: Diagnosis not present

## 2018-05-09 DIAGNOSIS — I10 Essential (primary) hypertension: Secondary | ICD-10-CM | POA: Insufficient documentation

## 2018-05-09 DIAGNOSIS — R079 Chest pain, unspecified: Secondary | ICD-10-CM | POA: Diagnosis not present

## 2018-05-09 DIAGNOSIS — Z79899 Other long term (current) drug therapy: Secondary | ICD-10-CM | POA: Diagnosis not present

## 2018-05-09 DIAGNOSIS — R000 Tachycardia, unspecified: Secondary | ICD-10-CM | POA: Diagnosis not present

## 2018-05-09 HISTORY — DX: Essential (primary) hypertension: I10

## 2018-05-09 HISTORY — DX: Supraventricular tachycardia: I47.1

## 2018-05-09 HISTORY — DX: Supraventricular tachycardia, unspecified: I47.10

## 2018-05-09 LAB — BASIC METABOLIC PANEL
Anion gap: 9 (ref 5–15)
BUN: 13 mg/dL (ref 6–20)
CHLORIDE: 104 mmol/L (ref 98–111)
CO2: 26 mmol/L (ref 22–32)
CREATININE: 0.91 mg/dL (ref 0.61–1.24)
Calcium: 9.5 mg/dL (ref 8.9–10.3)
Glucose, Bld: 127 mg/dL — ABNORMAL HIGH (ref 70–99)
Potassium: 4 mmol/L (ref 3.5–5.1)
SODIUM: 139 mmol/L (ref 135–145)

## 2018-05-09 LAB — I-STAT TROPONIN, ED: Troponin i, poc: 0 ng/mL (ref 0.00–0.08)

## 2018-05-09 LAB — CBC
HEMATOCRIT: 44.8 % (ref 39.0–52.0)
HEMOGLOBIN: 15.4 g/dL (ref 13.0–17.0)
MCH: 31.5 pg (ref 26.0–34.0)
MCHC: 34.4 g/dL (ref 30.0–36.0)
MCV: 91.6 fL (ref 78.0–100.0)
Platelets: 282 10*3/uL (ref 150–400)
RBC: 4.89 MIL/uL (ref 4.22–5.81)
RDW: 11 % — AB (ref 11.5–15.5)
WBC: 10.5 10*3/uL (ref 4.0–10.5)

## 2018-05-09 MED ORDER — IOPAMIDOL (ISOVUE-370) INJECTION 76%
INTRAVENOUS | Status: AC
  Filled 2018-05-09: qty 100

## 2018-05-09 MED ORDER — IOPAMIDOL (ISOVUE-370) INJECTION 76%
100.0000 mL | Freq: Once | INTRAVENOUS | Status: AC | PRN
  Administered 2018-05-09: 100 mL via INTRAVENOUS

## 2018-05-09 NOTE — ED Triage Notes (Signed)
To ED via [facility] from home with c/o palpitations and fatigue that started today. Patient has had palpitations in past month, increased fatigue, has increased metoprolol to 25mg  tid. Had an ablation by Dr. Ladona Ridgel in past-7 yrs ago, Dr. Sharyn Lull did an echo last month in office.

## 2018-05-09 NOTE — ED Notes (Signed)
Patient transported to CT 

## 2018-05-09 NOTE — ED Notes (Signed)
Pt states that he gets disoriented and faint feeling while at work - "can't focus and put words together when this happens" has been feeling "bad" for a month,

## 2018-05-09 NOTE — ED Provider Notes (Signed)
[facility] EMERGENCY DEPARTMENT Provider Note   CSN: [id number] Arrival date & time: 05/09/18  1527     History   Chief Complaint Chief Complaint  Patient presents with  . Palpitations    HPI Jake Huff is a 42 y.o. male.  HPI Patient has a prior history of SVT with ablation and a first-degree relative with hypertrophic obstructive cardiomyopathy.  Has had echocardiograms and has not tested positive.  Patient has had several weeks now of increasing exertional fatigue and dyspnea atypical for him.  He reports normally he works out and is very active.  Lately he is started to get extreme fatigue and having to quit early.  Also started to get chest pressure with exertion.  Today, he felt that his heart was racing and took a reading that showed heart rate in the 130s.  He reports this is a first time in a long time that felt like he had SVT.  And took metoprolol today.  He has been increasing the dose such that he takes metoprolol 25 mg 3 times a day now.  He sees Dr. Sharyn Lull for cardiology. Past Medical History:  Diagnosis Date  . H/O atrioventricular nodal ablation   . Hypertension   . SVT (supraventricular tachycardia) [facility])     Patient Active Problem List   Diagnosis Date Noted  . HYPERCHOLESTEROLEMIA 11/30/2009  . SUPRAVENTRICULAR TACHYCARDIA, HX OF 11/30/2009  . CHEST PAIN, ATYPICAL, HX OF 11/30/2009    Past Surgical History:  Procedure Laterality Date  . FINGER SURGERY Left    ring finger        Home Medications    Prior to Admission medications   Medication Sig Start Date End Date Taking? Authorizing Provider  ALPRAZolam (XANAX) 0.25 MG tablet Take 0.25 mg by mouth 3 (three) times daily as needed for anxiety.   Yes [provider]  cetirizine (ZYRTEC) 10 MG tablet Take 10 mg by mouth daily.   Yes [provider]  metoprolol succinate (TOPROL-XL) 25 MG 24 hr tablet Take 25 mg by mouth See admin instructions. Twice daily, may  take one additional dose as needed for palpatations   Yes [provider]  Multiple Vitamin (MULTIVITAMIN WITH MINERALS) TABS tablet Take 1 tablet by mouth daily.   Yes [provider]  naproxen (NAPROSYN) 500 MG tablet Take 500 mg by mouth 2 (two) times daily as needed (foot pain).   Yes [provider]  omeprazole (PRILOSEC) 20 MG capsule Take 40 mg by mouth at bedtime.    Yes [provider]    Family History No family history on file. Patient reports family history is positive for hypertrophic cardiomyopathy. Social History Social History   Tobacco Use  . Smoking status: Former Smoker    Last attempt to quit: 12/05/2008    Years since quitting: 9.4  . Smokeless tobacco: Never Used  Substance Use Topics  . Alcohol use: Not Currently    Comment: not in over month per pt  . Drug use: Never     Allergies   Penicillins   Review of Systems Review of Systems 10 Systems reviewed and are negative for acute change except as noted in the HPI.   Physical Exam Updated Vital Signs BP 118/72   Pulse 60   Resp 19   Ht 6\' 1"  (1.854 m)   Wt 86.2 kg (190 lb)   SpO2 96%   BMI 25.07 kg/m   Physical Exam  Constitutional: He is oriented to  person, place, and time. He appears well-developed and well-nourished.  HENT:  Head: Normocephalic and atraumatic.  Eyes: Pupils are equal, round, and reactive to light. EOM are normal.  Neck: Neck supple.  Cardiovascular: Normal rate, regular rhythm, normal heart sounds and intact distal pulses.  Pulmonary/Chest: Effort normal and breath sounds normal.  Abdominal: Soft. Bowel sounds are normal. He exhibits no distension. There is no tenderness.  Musculoskeletal: Normal range of motion. He exhibits no edema or tenderness.  Neurological: He is alert and oriented to person, place, and time. He has normal strength. Coordination normal. GCS eye subscore is 4. GCS verbal subscore is 5. GCS motor subscore is 6.    Skin: Skin is warm, dry and intact.  Psychiatric: He has a normal mood and affect.     ED Treatments / Results  Labs (all labs ordered are listed, but only abnormal results are displayed) Labs Reviewed  BASIC METABOLIC PANEL - Abnormal; Notable for the following components:      Result Value   Glucose, Bld 127 (*)    All other components within normal limits  CBC - Abnormal; Notable for the following components:   RDW 11.0 (*)    All other components within normal limits  I-STAT TROPONIN, ED    EKG EKG Interpretation  Date/Time:  Saturday May 09 2018 15:32:49 EDT Ventricular Rate:  95 PR Interval:    QRS Duration: 104 QT Interval:  363 QTC Calculation: 457 R Axis:   83 Text Interpretation:  Sinus rhythm Probable left atrial enlargement Anterolateral infarct, age indeterminate no change from old Confirmed by Arby Barrette [phone]) on 05/09/2018 5:13:50 PM Also confirmed by Arby Barrette [phone]), editor Sheppard Evens ([id number])  on 05/10/2018 9:04:41 AM   Radiology Dg Chest 2 View  Result Date: 05/09/2018 CLINICAL DATA:  Chest pain and discomfort for 1 month. Palpitations. Dizziness and diaphoresis. EXAM: CHEST - 2 VIEW COMPARISON:  07/09/2010 FINDINGS: The heart size and mediastinal contours are within normal limits. Both lungs are clear. The visualized skeletal structures are unremarkable. IMPRESSION: Negative.  No active cardiopulmonary disease. Electronically Signed   By: Myles Rosenthal M.D.   On: 05/09/2018 16:10   Ct Angio Chest/abd/pel For Dissection W And/or W/wo  Result Date: 05/09/2018 CLINICAL DATA:  Palpitations and fatigue.  Chest pain and back pain. EXAM: CT ANGIOGRAPHY CHEST, ABDOMEN AND PELVIS TECHNIQUE: Multidetector CT imaging through the chest, abdomen and pelvis was performed using the standard protocol during bolus administration of intravenous contrast. Multiplanar reconstructed images and MIPs were obtained and reviewed to evaluate the vascular anatomy.  CONTRAST:  ISOVUE-370 IOPAMIDOL (ISOVUE-370) INJECTION 76% COMPARISON:  Chest radiograph 05/09/2018 FINDINGS: CTA CHEST FINDINGS Cardiovascular: --Heart: The heart size is normal.  There is nopericardial effusion. --Aorta: The course and caliber of the thoracic aorta are normal. There is no aortic atherosclerotic calcification. Precontrast images show no aortic intramural hematoma. There is no blood pool, dissection or penetrating ulcer demonstrated on arterial phase postcontrast imaging. There is a conventional 3 vessel aortic arch branching pattern. The proximal arch vessels are widely patent. --Pulmonary Arteries: Contrast timing is optimized for preferential opacification of the aorta. Within that limitation, normal central pulmonary arteries. Mediastinum/Nodes: No mediastinal, hilar or axillary lymphadenopathy. The visualized thyroid and thoracic esophageal course are unremarkable. Lungs/Pleura: No pulmonary nodules or masses. No pleural effusion or pneumothorax. No focal airspace consolidation. No focal pleural abnormality. Musculoskeletal: No chest wall abnormality. No acute osseous findings. Review of the MIP images confirms the above findings. CTA ABDOMEN  AND PELVIS FINDINGS VASCULAR Aorta: Normal caliber aorta without aneurysm, dissection, vasculitis or hemodynamically significant stenosis. There is no aortic atherosclerosis. Celiac: No aneurysm, dissection or hemodynamically significant stenosis. Normal branching pattern. SMA: Widely patent without dissection or stenosis. Renals: Single renal arteries bilaterally. No aneurysm, dissection, stenosis or evidence of fibromuscular dysplasia. IMA: Patent without abnormality. Inflow: No aneurysm, stenosis or dissection. Veins: Normal course and caliber of the major veins. Assessment is otherwise limited by the arterial dominant contrast phase. Review of the MIP images confirms the above findings. NON-VASCULAR Hepatobiliary: Normal hepatic contours and  density. No visible biliary dilatation. Normal gallbladder. Pancreas: Normal contours without ductal dilatation. No peripancreatic fluid collection. Spleen: Normal arterial phase splenic enhancement pattern. Adrenals/Urinary Tract: --Adrenal glands: Normal. --Right kidney/ureter: No hydronephrosis or perinephric stranding. No nephrolithiasis. No obstructing ureteral stones. 2 cm interpolar cyst. --Left kidney/ureter: No hydronephrosis or perinephric stranding. No nephrolithiasis. No obstructing ureteral stones. --Urinary bladder: Unremarkable. Stomach/Bowel: --Stomach/Duodenum: No hiatal hernia or other gastric abnormality. Normal duodenal course and caliber. --Small bowel: No dilatation or inflammation. --Colon: No focal abnormality. --Appendix: Normal. Lymphatic:  No abdominal or pelvic lymphadenopathy. Reproductive: Normal prostate and seminal vesicles. Musculoskeletal. No bony spinal canal stenosis or focal osseous abnormality. Other: None. Review of the MIP images confirms the above findings. IMPRESSION: No acute aortic syndrome or other acute abnormality of the chest, abdomen pelvis. Electronically Signed   By: Deatra Robinson M.D.   On: 05/09/2018 18:42    Procedures Procedures (including critical care time)  Medications Ordered in ED Medications  iopamidol (ISOVUE-370) 76 % injection 100 mL (100 mLs Intravenous Contrast Given 05/09/18 1800)     Initial Impression / Assessment and Plan / ED Course  I have reviewed the triage vital signs and the nursing notes.  Pertinent labs & imaging results that were available during my care of the patient were reviewed by me and considered in my medical decision making (see chart for details).     Final Clinical Impressions(s) / ED Diagnoses   Final diagnoses:  Palpitations  Other fatigue  Precordial pain   Patient diagnostic evaluation is within normal limits.  CT angiogram of the chest does not show any vascular anomalies or other pulmonary  findings.  She has had echocardiograms done with Dr. Sharyn Lull without significant suggestion of obstruction from cardiomyopathy or ovular dysfunction.  Patient has prior history of SVT with ablation.  He is getting episodes of feeling his heart racing and may be getting episodes of SVT again.  Patient is counseled he is close follow-up with cardiology and may need to wear an event monitor again.  Patient is counseled for only light activity without the physical exertion until his next follow-up with cardiology. ED Discharge Orders    None       Arby Barrette, MD 05/10/18 [phone]

## 2018-05-09 NOTE — ED Notes (Signed)
Patient transported to X-ray 

## 2018-07-13 ENCOUNTER — Encounter: Payer: Self-pay | Admitting: Internal Medicine

## 2018-07-13 ENCOUNTER — Ambulatory Visit: Payer: BLUE CROSS/BLUE SHIELD | Admitting: Internal Medicine

## 2018-07-13 VITALS — BP 126/88 | HR 82 | Ht 73.0 in | Wt 189.6 lb

## 2018-07-13 DIAGNOSIS — Z8679 Personal history of other diseases of the circulatory system: Secondary | ICD-10-CM

## 2018-07-13 DIAGNOSIS — I471 Supraventricular tachycardia: Secondary | ICD-10-CM | POA: Diagnosis not present

## 2018-07-13 DIAGNOSIS — I421 Obstructive hypertrophic cardiomyopathy: Secondary | ICD-10-CM | POA: Diagnosis not present

## 2018-07-13 DIAGNOSIS — Z9889 Other specified postprocedural states: Secondary | ICD-10-CM

## 2018-07-13 NOTE — Progress Notes (Signed)
ELECTROPHYSIOLOGY CONSULT NOTE  Patient ID: Aedin Saucer, MRN: [id number], DOB/AGE: [birth year] 42 y.o. Admit date: (Not on file) Date of Consult: 07/13/2018  Primary Physician: Rinaldo Cloud, MD Primary Cardiologist: MH     Gifford Enge is a 42 y.o. male who is being seen today for the evaluation of palpitations at the request of Dr [facility]    HPI Yoltzin Lahner is a 42 y.o. male with a history of SVT and undergone AV nodal modification for AVNRT by Dr. Leonia Reeves 2011.  This was complicated by postprocedural junctional rhythm that gradually abated.  He has a history of palpitations that they back into his teens.  There may been 2 distinct kinds which became clear following his ablation, the first of the abrupt onset/offset which has not recurred since his ablation until just a few weeks ago and a more prevalent tachypalpitations frequently triggered by exertion and prolonged standing.  Over recent years this is become increasingly problematic associate with heat intolerance, shower intolerance, exercise intolerance.  His diet he thinks is fluid replete but he uses a vitamin; urine color is yellow.  He has had high blood pressure so salt has not been option.  His sister has been diagnosed with hypertrophic cardia myopathy at [facility].  She has obstruction.  Their father and paternal aunt have also been diagnosed with HCM.  His sister suggest that he might have autonomic dysfunction.  These 2 concerns prompt this evaluation.  Following his ablation by Dr. Leonia Reeves he was also having problems with exertional chest discomfort.  He underwent catheterization by Dr. [facility] demonstrating no obstructive disease.  This was 2013.  He was ultimately started on a beta-blocker which was uptitrated this summer.  The significant functional deterioration earlier this summer was in large part mitigated by  Past Medical History:  Diagnosis Date  . H/O atrioventricular nodal ablation   . Hypertension   . SVT  (supraventricular tachycardia) [facility])       Surgical History:  Past Surgical History:  Procedure Laterality Date  . FINGER SURGERY Left    ring finger     Home Meds: Prior to Admission medications   Medication Sig Start Date End Date Taking? Authorizing Provider  ALPRAZolam (XANAX) 0.25 MG tablet Take 0.25 mg by mouth 3 (three) times daily as needed for anxiety.   Yes [provider]  cetirizine (ZYRTEC) 10 MG tablet Take 10 mg by mouth daily.   Yes [provider]  metoprolol succinate (TOPROL-XL) 25 MG 24 hr tablet Take 25 mg by mouth See admin instructions. Twice daily, may take one additional dose as needed for palpatations   Yes [provider]  metoprolol succinate (TOPROL-XL) 50 MG 24 hr tablet Take 1 tablet by mouth daily with breakfast. 06/28/18  Yes [provider]  Multiple Vitamin (MULTIVITAMIN WITH MINERALS) TABS tablet Take 1 tablet by mouth daily.   Yes [provider]  naproxen (NAPROSYN) 500 MG tablet Take 500 mg by mouth 2 (two) times daily as needed (foot pain).   Yes [provider]  omeprazole (PRILOSEC) 20 MG capsule Take 40 mg by mouth at bedtime.    Yes [provider]    Allergies:  Allergies  Allergen Reactions  . Penicillins Anaphylaxis, Hives and Swelling    Has patient had a PCN reaction causing immediate rash, facial/tongue/throat swelling, SOB or lightheadedness with hypotension: Yes Has patient had a PCN reaction causing severe rash involving mucus membranes or skin necrosis: No Has patient  had a PCN reaction that required hospitalization: No Has patient had a PCN reaction occurring within the last 10 years: No If all of the above answers are "NO", then may proceed with Cephalosporin use.     Social History   Socioeconomic History  . Marital status: Married    Spouse name: Not on file  . Number of children: Not on file  . Years of education: Not on file  . Highest education level: Not  on file  Occupational History  . Not on file  Social Needs  . Financial resource strain: Not on file  . Food insecurity:    Worry: Not on file    Inability: Not on file  . Transportation needs:    Medical: Not on file    Non-medical: Not on file  Tobacco Use  . Smoking status: Former Smoker    Last attempt to quit: 12/05/2008    Years since quitting: 9.6  . Smokeless tobacco: Never Used  Substance and Sexual Activity  . Alcohol use: Not Currently    Comment: not in over month per pt  . Drug use: Never  . Sexual activity: Not on file  Lifestyle  . Physical activity:    Days per week: Not on file    Minutes per session: Not on file  . Stress: Not on file  Relationships  . Social connections:    Talks on phone: Not on file    Gets together: Not on file    Attends religious service: Not on file    Active member of club or organization: Not on file    Attends meetings of clubs or organizations: Not on file    Relationship status: Not on file  . Intimate partner violence:    Fear of current or ex partner: Not on file    Emotionally abused: Not on file    Physically abused: Not on file    Forced sexual activity: Not on file  Other Topics Concern  . Not on file  Social History Narrative  . Not on file     History reviewed. No pertinent family history.   ROS:  Please see the history of present illness.     All other systems reviewed and negative.    Physical Exam: Blood pressure 126/88, pulse 82, height 6\' 1"  (1.854 m), weight 189 lb 9.6 oz (86 kg), SpO2 99 %. General: Well developed, well nourished male in no acute distress. Head: Normocephalic, atraumatic, sclera non-icteric, no xanthomas, nares are without discharge. EENT: normal  Lymph Nodes:  none Neck: Negative for carotid bruits. JVD not elevated. Back:without scoliosis kyphosis Lungs: Clear bilaterally to auscultation without wheezes, rales, or rhonchi. Breathing is unlabored. Heart: RRR with S1 S2. No  murmur  . No rubs, or gallops appreciated. Abdomen: Soft, non-tender, non-distended with normoactive bowel sounds. No hepatomegaly. No rebound/guarding. No obvious abdominal masses. Msk:  Strength and tone appear normal for age. Extremities: No clubbing or cyanosis. No edema.  Distal pedal pulses are 2+ and equal bilaterally. Skin: Warm and Dry Neuro: Alert and oriented X 3. CN III-XII intact Grossly normal sensory and motor function . Psych:  Responds to questions appropriately with a normal affect.      Labs: Cardiac Enzymes No results for input(s): CKTOTAL, CKMB, TROPONINI in the last 72 hours. CBC Lab Results  Component Value Date   WBC 10.5 05/09/2018   HGB 15.4 05/09/2018   HCT 44.8 05/09/2018   MCV 91.6 05/09/2018   PLT 282  05/09/2018   PROTIME: No results for input(s): LABPROT, INR in the last 72 hours. Chemistry No results for input(s): NA, K, CL, CO2, BUN, CREATININE, CALCIUM, PROT, BILITOT, ALKPHOS, ALT, AST, GLUCOSE in the last 168 hours.  Invalid input(s): LABALBU Lipids Lab Results  Component Value Date   CHOL  07/17/2010    189        ATP III CLASSIFICATION:  <200     mg/dL   Desirable  161-096  mg/dL   Borderline High  >=045    mg/dL   High          HDL 38 (L) 07/17/2010   LDLCALC (H) 07/17/2010    106        Total Cholesterol/HDL:CHD Risk Coronary Heart Disease Risk Table                     Men   Women  1/2 Average Risk   3.4   3.3  Average Risk       5.0   4.4  2 X Average Risk   9.6   7.1  3 X Average Risk  23.4   11.0        Use the calculated Patient Ratio above and the CHD Risk Table to determine the patient's CHD Risk.        ATP III CLASSIFICATION (LDL):  <100     mg/dL   Optimal  409-811  mg/dL   Near or Above                    Optimal  130-159  mg/dL   Borderline  914-782  mg/dL   High  >956     mg/dL   Very High   TRIG 213 (H) 07/17/2010   BNP Pro B Natriuretic peptide (BNP)  Date/Time Value Ref Range Status  12/26/2009 07:44 PM  64.0 0.0 - 100.0 pg/mL Final   Thyroid Function Tests: No results for input(s): TSH, T4TOTAL, T3FREE, THYROIDAB in the last 72 hours.  Invalid input(s): FREET3 Miscellaneous No results found for: DDIMER  Radiology/Studies:  No results found.  EKG: Sinus rhythm at 82 Intervals 13/08/39 Lateral Q waves   Assessment and Plan:  Hypertrophic heart disease  SVT s/p ablation (AVNRT) [facility] 2011  Exertional tachypalpitations presyncope suspect dysautonomia     The patient has a history of SVT with ablation.  This was followed by chest discomfort and tachypalpitations.  His long-standing history of SVT as a little bit hard to sort out which was which;, however, I suspect that many of his symptoms now are related to autonomic dysfunction.  Ameeth be further aggravated by what sounds like hypertrophic cardiomyopathy.  His echocardiogram is read as having a 15-16 mm posterior wall.  His sister, a physician, has been diagnosed with HCM.  This will need to be clarified.  We will start with an echocardiogram if this is on clear, we will proceed with cardiac MR    There is no objective information to support autonomic dysfunction; however, he is on beta-blockers and these have been markedly beneficial in attenuating symptoms.  Not withstanding the lack of objective information, is symptoms certainly are consistent with autonomic dysfunction.  We have discussed the physiology and the importance of fluid repletion.  We will have him hold his vitamins so as to see his urine color.  His elevated blood pressure precludes sodium  We will also discuss, following his echo, the role of exercise.  Virl Axe

## 2018-07-13 NOTE — Patient Instructions (Signed)
Medication Instructions:  Your physician recommends that you continue on your current medications as directed. Please refer to the Current Medication list given to you today.  Labwork: None ordered.  Testing/Procedures: Your physician has requested that you have an echocardiogram. Echocardiography is a painless test that uses sound waves to create images of your heart. It provides your doctor with information about the size and shape of your heart and how well your heart's chambers and valves are working. This procedure takes approximately one hour. There are no restrictions for this procedure.  Follow-Up:  Your physician recommends that you schedule a follow-up appointment based off your echocardiogram.    Dr Graciela Husbands may want to order a cardiac MRI depending on what your ECHO reveals. We will be in touch with you after your echo has been read.   Any Other Special Instructions Will Be Listed Below (If Applicable).     If you need a refill on your cardiac medications before your next appointment, please call your pharmacy.

## 2018-07-16 ENCOUNTER — Institutional Professional Consult (permissible substitution): Payer: BLUE CROSS/BLUE SHIELD | Admitting: Internal Medicine

## 2018-07-17 ENCOUNTER — Ambulatory Visit (HOSPITAL_COMMUNITY): Payer: BLUE CROSS/BLUE SHIELD | Attending: Internal Medicine

## 2018-07-17 ENCOUNTER — Other Ambulatory Visit: Payer: Self-pay

## 2018-07-17 DIAGNOSIS — Z87891 Personal history of nicotine dependence: Secondary | ICD-10-CM | POA: Insufficient documentation

## 2018-07-17 DIAGNOSIS — I10 Essential (primary) hypertension: Secondary | ICD-10-CM | POA: Diagnosis not present

## 2018-07-17 DIAGNOSIS — I421 Obstructive hypertrophic cardiomyopathy: Secondary | ICD-10-CM | POA: Insufficient documentation

## 2018-07-17 DIAGNOSIS — I471 Supraventricular tachycardia: Secondary | ICD-10-CM | POA: Insufficient documentation

## 2018-07-27 ENCOUNTER — Institutional Professional Consult (permissible substitution): Payer: BLUE CROSS/BLUE SHIELD | Admitting: Internal Medicine

## 2018-07-28 ENCOUNTER — Telehealth: Payer: Self-pay

## 2018-07-28 ENCOUNTER — Telehealth: Payer: Self-pay | Admitting: Internal Medicine

## 2018-07-28 DIAGNOSIS — I421 Obstructive hypertrophic cardiomyopathy: Secondary | ICD-10-CM

## 2018-07-28 DIAGNOSIS — R000 Tachycardia, unspecified: Secondary | ICD-10-CM

## 2018-07-28 NOTE — Telephone Encounter (Signed)
Reviewed Echo results with pt. He is agreeable to having the cardiac MRI performed. He states recently his tachy palpitations have been getting worse. He is concerned with how hard and how often he should work out. There are times he feels as though he may pass out. I have advised pt to take it easy. He should avoid exercises which exacerbate his symptoms. I will discuss this with Dr Graciela Husbands for recommendation as well.   Pt also asks if he might wear an event monitor as his palpitations and tachycardia have been getting worse/more frequent. I told pt I would discuss with Dr Graciela Husbands and contact him back with his suggestions.   Pt has verbalized understanding and will await my return call.

## 2018-07-28 NOTE — Telephone Encounter (Signed)
New Message:   Patient calling because he had a EHCO and have not heard from anyone. Please call Pt.

## 2018-07-28 NOTE — Telephone Encounter (Signed)
-----   Message from Steven C Klein, MD sent at 07/27/2018  4:23 PM EDT ----- Please Inform Patient Jake Huff showed  normal heart muscle function; she was raised as to focal septal hypertrophy.  Given the sister's history, diagnosed with HCM, would recommend that he undergo cardiac MR.  

## 2018-07-28 NOTE — Telephone Encounter (Signed)
-----   Message from Duke Salvia, MD sent at 07/27/2018  4:23 PM EDT ----- Please Inform Patient Echo showed  normal heart muscle function; she was raised as to focal septal hypertrophy.  Given the sister's history, diagnosed with HCM, would recommend that he undergo cardiac MR.

## 2018-07-30 NOTE — Telephone Encounter (Signed)
Spoke with Jake Huff and he has agreed to have a monitor placed. Given his upcoming MRI, I will request it be placed after this is completed. Jake Huff understands Dr Odessa Fleming recommendation to discontinue rigorous exercise until the results of his MRI have resulted. Jake Huff agrees to this plan as well.  Jake Huff continues to c/o restless nights with high HR's and anxiety. I advised him he may try OTC melatonin, as this is usually Dr Odessa Fleming first sleep aid recommendation. Jake Huff was counseled to take his melatonin and go directly to bed rather than staying awake for a while after taking it. Jake Huff verbalized understanding.  Jake Huff also had questions regarding his Claritin. He wondered if this would aggravate his heart rate. I advised him to stay away from any Claritin that included a decongestant which can cause an increase in HR.   Jake Huff has verbalized understanding of the above and agrees with plan.

## 2018-07-31 ENCOUNTER — Encounter: Payer: Self-pay | Admitting: *Deleted

## 2018-08-10 ENCOUNTER — Ambulatory Visit (HOSPITAL_COMMUNITY)
Admission: RE | Admit: 2018-08-10 | Discharge: 2018-08-10 | Disposition: A | Discharge status: Home / Self Care | Payer: BLUE CROSS/BLUE SHIELD | Source: Ambulatory Visit | Attending: Internal Medicine | Admitting: Internal Medicine

## 2018-08-10 DIAGNOSIS — I422 Other hypertrophic cardiomyopathy: Secondary | ICD-10-CM | POA: Insufficient documentation

## 2018-08-10 DIAGNOSIS — I421 Obstructive hypertrophic cardiomyopathy: Secondary | ICD-10-CM | POA: Insufficient documentation

## 2018-08-10 LAB — CREATININE, SERUM
CREATININE: 0.98 mg/dL (ref 0.61–1.24)
GFR calc Af Amer: >60 mL/min (ref >60)

## 2018-08-10 MED ORDER — GADOBUTROL 1 MMOL/ML IV SOLN
9.0000 mL | Freq: Once | INTRAVENOUS | Status: AC | PRN
  Administered 2018-08-10: 9 mL via INTRAVENOUS

## 2018-08-19 ENCOUNTER — Ambulatory Visit (INDEPENDENT_AMBULATORY_CARE_PROVIDER_SITE_OTHER): Payer: BLUE CROSS/BLUE SHIELD

## 2018-08-19 ENCOUNTER — Telehealth: Payer: Self-pay | Admitting: Internal Medicine

## 2018-08-19 DIAGNOSIS — I421 Obstructive hypertrophic cardiomyopathy: Secondary | ICD-10-CM | POA: Diagnosis not present

## 2018-08-19 DIAGNOSIS — R000 Tachycardia, unspecified: Secondary | ICD-10-CM | POA: Diagnosis not present

## 2018-08-19 NOTE — Telephone Encounter (Signed)
Appointment was to be determined by echo, echo then lead to cardiac MRI. Cardiac MRI did not specify when f/u would be?

## 2018-08-19 NOTE — Telephone Encounter (Signed)
New message    Pt wants to know when he needs to have a follow up appt with Dr. Graciela Husbands.

## 2018-08-20 NOTE — Telephone Encounter (Signed)
Spoke with the patient about his results, discussed some of the points of confusion. He expressed understanding and had no further questions.

## 2018-08-20 NOTE — Telephone Encounter (Signed)
Please ensure f/u is scheduled accordingly.

## 2018-08-20 NOTE — Telephone Encounter (Signed)
Follow up within 1 month of Jake Huff return.

## 2018-08-20 NOTE — Telephone Encounter (Signed)
Pt wanted to make appt after Zio, appt made 09-10-18 with Graciela Husbands. Pt saw MRI results are available on mychart but didn't understand them, Asking for nurse call back to ger results.

## 2018-08-28 NOTE — Telephone Encounter (Signed)
Patient calling  Patient called Hoy Register first and was transferred to [employer] that Dr. Graciela Husbands made him aware since [place] was [place] to ask for Herbert Seta Wants to discuss results Please call

## 2018-08-28 NOTE — Telephone Encounter (Addendum)
I spoke with the patient. I have reviewed results of his Cardiac MRI with him and that Dr. Graciela Husbands does feel this confirms HCM.  He does confirm he has a sister with HCM that has ended up with a myectomy and ICD placement since he saw Dr. Graciela Husbands. She did have genetic testing which confirmed 2 genes related to HCM.  I have advised the patient that we can offer him an appointment to see our geneticist, Dr. Jomarie Longs for some pre-counseling and if he decided to be tested himself, she could do this.  He is aware to have the name of the genes identified in his sister when having counseling. He states that he does have the name of one gene on his phone, but not the other, which was rare according to his sister. I did confirm that he has a daughter that he is most concerned about at this time.  He states his sister, who is an MD, with the diagnosis advised that he maybe could skip testing and just have his daughter tested. I advised that this would be a discussion that Dr. Jomarie Longs could have with him in a counseling session and help him determine what the best course would be.   He is currently wearing his ZIO monitor and due to take this off next week. He did work out at [employer] this week and after lifting weights, felt very fatigued for about 30 minutes after. He is having a lot of issues with palpitations and feels like his metoprolol is not really helping with this completely.  I have advised that he turn his monitor in next week when due, keep his follow up with Dr. Graciela Husbands on 11/21 as scheduled, and consider an appointment with Dr. Jomarie Longs.   The patient further reported that when his sister turned her monitor in, they had her wear a Life Vest until her ICD could be implanted. I have advised the patient, if for any reason, he passes out, to please call 911 and report to the ER for his safety.  The patient voices understanding of all of the above and is agreeable.   To Dr. Graciela Husbands as an Lorain Childes.

## 2018-08-30 DIAGNOSIS — R42 Dizziness and giddiness: Secondary | ICD-10-CM | POA: Diagnosis not present

## 2018-08-30 DIAGNOSIS — I10 Essential (primary) hypertension: Secondary | ICD-10-CM | POA: Diagnosis not present

## 2018-08-30 DIAGNOSIS — R531 Weakness: Secondary | ICD-10-CM | POA: Diagnosis not present

## 2018-08-30 DIAGNOSIS — R000 Tachycardia, unspecified: Secondary | ICD-10-CM | POA: Diagnosis not present

## 2018-09-03 ENCOUNTER — Telehealth: Payer: Self-pay | Admitting: Cardiology

## 2018-09-03 DIAGNOSIS — I421 Obstructive hypertrophic cardiomyopathy: Secondary | ICD-10-CM | POA: Diagnosis not present

## 2018-09-03 DIAGNOSIS — R000 Tachycardia, unspecified: Secondary | ICD-10-CM | POA: Diagnosis not present

## 2018-09-03 NOTE — Telephone Encounter (Signed)
Received page from patient.  He has been feeling palpitations for the past 2 hours or so.  His heart rates have predominantly been in the 110s to 120s, but have occasionally bursts into the 140s.  He specifically denies any chest pains, shortness of breath, lightheadedness, or syncope.  He has taken an extra 50 mg of metoprolol.  He has been hypertensive otherwise.  In the absence of any alarm symptoms, as listed above, I advised the patient to call the office in the morning to arrange for an EKG and possibly urgent appointment.  He knows to present to the emergency department overnight if he were to have any of the symptoms above.  Esmond Plants, MD

## 2018-09-04 ENCOUNTER — Telehealth: Payer: Self-pay | Admitting: Internal Medicine

## 2018-09-04 ENCOUNTER — Encounter: Payer: Self-pay | Admitting: Internal Medicine

## 2018-09-04 ENCOUNTER — Ambulatory Visit (INDEPENDENT_AMBULATORY_CARE_PROVIDER_SITE_OTHER): Payer: BLUE CROSS/BLUE SHIELD | Admitting: Internal Medicine

## 2018-09-04 VITALS — BP 128/90 | HR 65 | Ht 73.0 in | Wt 190.2 lb

## 2018-09-04 DIAGNOSIS — I471 Supraventricular tachycardia: Secondary | ICD-10-CM | POA: Diagnosis not present

## 2018-09-04 DIAGNOSIS — I421 Obstructive hypertrophic cardiomyopathy: Secondary | ICD-10-CM

## 2018-09-04 MED ORDER — METOPROLOL SUCCINATE ER 100 MG PO TB24: 100.0000 mg | ORAL_TABLET | Freq: Every day | ORAL | 3 refills | Status: DC

## 2018-09-04 MED ORDER — PROPRANOLOL HCL 20 MG PO TABS: 20.0000 mg | ORAL_TABLET | ORAL | 3 refills | Status: DC | PRN

## 2018-09-04 NOTE — Patient Instructions (Signed)
Medication Instructions:  Your physician has recommended you make the following change in your medication:   1. Increase your Metoprolol Succinate to 100mg  once daily 2. Begin Propranolol, 40mg  tablet, every 4 hours for tachy palpitations   Labwork: You will have labs drawn today: TSH  Testing/Procedures: None ordered.  Follow-Up: Your physician recommends that you schedule a follow-up appointment   First Available with Dr Jomarie Longs  3 months with Dr Graciela Husbands  Any Other Special Instructions Will Be Listed Below (If Applicable).     If you need a refill on your cardiac medications before your next appointment, please call your pharmacy.

## 2018-09-04 NOTE — H&P (View-Only) (Signed)
Patient Care Team: Rinaldo Cloud, MD as PCP - General (Cardiology)   HPI  Jake Huff is a 42 y.o. male Seen in follow-up for hypertrophic cardia myopathy recently diagnosed and recurrent tachypalpitations.  In fact it is this that prompted him to be added onto the schedule today.  He has been having episodes of tachypalpitations.  He was given a ZIO Patch.  This demonstrated sinus tachycardia.  It also demonstrated recurrent episodes of nonsustained ventricular tachycardia.  He has been taking metoprolol to try to squelch his tachypalpitations.  They seem occasion to abruptly terminate although they seem to increase in rate rather gradually  No interval syncope  His sister just underwent myectomy and defibrillator implantation at [facility] testing was positive for myosin binding protein mutation  Records and Results Reviewed he also has an appointment to see soon  Past Medical History:  Diagnosis Date  . Hypertension   . Hypertrophic cardiomyopathy (HCC)   . Sinus tachycardia ? autonomic   . SVT (supraventricular tachycardia) (HCC) s/p AV nodal modification     Past Surgical History:  Procedure Laterality Date  . FINGER SURGERY Left    ring finger    Current Meds  Medication Sig  . ALPRAZolam (XANAX) 0.25 MG tablet Take 0.25 mg by mouth 3 (three) times daily as needed for anxiety.  . cetirizine (ZYRTEC) 10 MG tablet Take 10 mg by mouth daily.  . Multiple Vitamin (MULTIVITAMIN WITH MINERALS) TABS tablet Take 1 tablet by mouth daily.  . naproxen (NAPROSYN) 500 MG tablet Take 500 mg by mouth 2 (two) times daily as needed (foot pain).  Marland Kitchen omeprazole (PRILOSEC) 20 MG capsule Take 40 mg by mouth at bedtime.   . [DISCONTINUED] metoprolol succinate (TOPROL-XL) 25 MG 24 hr tablet Take 25 mg by mouth See admin instructions. Twice daily, may take one additional dose as needed for palpatations  . [DISCONTINUED] metoprolol succinate (TOPROL-XL) 50 MG 24 hr tablet  Take 1 tablet by mouth daily with breakfast.    Allergies  Allergen Reactions  . Penicillins Anaphylaxis, Hives and Swelling    Has patient had a PCN reaction causing immediate rash, facial/tongue/throat swelling, SOB or lightheadedness with hypotension: Yes Has patient had a PCN reaction causing severe rash involving mucus membranes or skin necrosis: No Has patient had a PCN reaction that required hospitalization: No Has patient had a PCN reaction occurring within the last 10 years: No If all of the above answers are "NO", then may proceed with Cephalosporin use.       Review of Systems negative except from HPI and PMH  Physical Exam BP 128/90   Pulse 65   Ht 6\' 1"  (1.854 m)   Wt 190 lb 3.2 oz (86.3 kg)   SpO2 98%   BMI 25.09 kg/m  Well developed and well nourished in no acute distress HENT normal E scleral and icterus clear Neck Supple JVP flat; carotids brisk and full Clear to ausculation  Regular rate and rhythm, no murmurs gallops or rub Soft with active bowel sounds No clubbing cyanosis  Edema Alert and oriented, grossly normal motor and sensory function Skin Warm and Dry  ECG demonstrates sinus rhythm at 65 Intervals 14/09/43  MRI was reviewed with the patient.  He has focal septal hypertrophy.  Assessment and  Plan  Hypertrophic cardiomyopathy with gadolinium enhancement  VT nonsustained  Sinus tachycardia doubt atrial tachycardia probably autonomic    The patient has sinus tachycardia which is been quite  disruptive.  We will check a TSH.  His hemoglobin was normal recently.  It could represent an atrial tachycardia given its variable relationship to exertion.  He may well need EP testing.  For now we will treat with metoprolol increasing his dose to 100 mg daily and using propranolol on a as needed basis.  I have offered to speak with his sister.  She is already mentioned to him that he might be a good candidate for defibrillator.  He will also be  referred to Dr. Jomarie Longs for genetic counseling.  He is a 38 year old daughter.  More than 50% of 40 min was spent in counseling related to the above         Current medicines are reviewed at length with the patient today .  The patient does not  have concerns regarding medicines.

## 2018-09-04 NOTE — Telephone Encounter (Signed)
Spoke with pt who has been having increased HR and palpitations recently. I have moved his appt up to today at 1030. Pt has agreed.

## 2018-09-04 NOTE — Progress Notes (Signed)
    Patient Care Team: Harwani, Mohan, MD as PCP - General (Cardiology)   HPI  Jake Huff is a 42 y.o. male Seen in follow-up for hypertrophic cardia myopathy recently diagnosed and recurrent tachypalpitations.  In fact it is this that prompted him to be added onto the schedule today.  He has been having episodes of tachypalpitations.  He was given a ZIO Patch.  This demonstrated sinus tachycardia.  It also demonstrated recurrent episodes of nonsustained ventricular tachycardia.  He has been taking metoprolol to try to squelch his tachypalpitations.  They seem occasion to abruptly terminate although they seem to increase in rate rather gradually  No interval syncope  His sister just underwent myectomy and defibrillator implantation at [facility]  Genetic testing was positive for myosin binding protein mutation  Records and Results Reviewed he also has an appointment to see soon  Past Medical History:  Diagnosis Date  . Hypertension   . Hypertrophic cardiomyopathy (HCC)   . Sinus tachycardia ? autonomic   . SVT (supraventricular tachycardia) (HCC) s/p AV nodal modification     Past Surgical History:  Procedure Laterality Date  . FINGER SURGERY Left    ring finger    Current Meds  Medication Sig  . ALPRAZolam (XANAX) 0.25 MG tablet Take 0.25 mg by mouth 3 (three) times daily as needed for anxiety.  . cetirizine (ZYRTEC) 10 MG tablet Take 10 mg by mouth daily.  . Multiple Vitamin (MULTIVITAMIN WITH MINERALS) TABS tablet Take 1 tablet by mouth daily.  . naproxen (NAPROSYN) 500 MG tablet Take 500 mg by mouth 2 (two) times daily as needed (foot pain).  . omeprazole (PRILOSEC) 20 MG capsule Take 40 mg by mouth at bedtime.   . [DISCONTINUED] metoprolol succinate (TOPROL-XL) 25 MG 24 hr tablet Take 25 mg by mouth See admin instructions. Twice daily, may take one additional dose as needed for palpatations  . [DISCONTINUED] metoprolol succinate (TOPROL-XL) 50 MG 24 hr tablet  Take 1 tablet by mouth daily with breakfast.    Allergies  Allergen Reactions  . Penicillins Anaphylaxis, Hives and Swelling    Has patient had a PCN reaction causing immediate rash, facial/tongue/throat swelling, SOB or lightheadedness with hypotension: Yes Has patient had a PCN reaction causing severe rash involving mucus membranes or skin necrosis: No Has patient had a PCN reaction that required hospitalization: No Has patient had a PCN reaction occurring within the last 10 years: No If all of the above answers are "NO", then may proceed with Cephalosporin use.       Review of Systems negative except from HPI and PMH  Physical Exam BP 128/90   Pulse 65   Ht 6' 1" (1.854 m)   Wt 190 lb 3.2 oz (86.3 kg)   SpO2 98%   BMI 25.09 kg/m  Well developed and well nourished in no acute distress HENT normal E scleral and icterus clear Neck Supple JVP flat; carotids brisk and full Clear to ausculation  Regular rate and rhythm, no murmurs gallops or rub Soft with active bowel sounds No clubbing cyanosis  Edema Alert and oriented, grossly normal motor and sensory function Skin Warm and Dry  ECG demonstrates sinus rhythm at 65 Intervals 14/09/43  MRI was reviewed with the patient.  He has focal septal hypertrophy.  Assessment and  Plan  Hypertrophic cardiomyopathy with gadolinium enhancement  VT nonsustained  Sinus tachycardia doubt atrial tachycardia probably autonomic    The patient has sinus tachycardia which is been quite   disruptive.  We will check a TSH.  His hemoglobin was normal recently.  It could represent an atrial tachycardia given its variable relationship to exertion.  He may well need EP testing.  For now we will treat with metoprolol increasing his dose to 100 mg daily and using propranolol on a as needed basis.  I have offered to speak with his sister.  She is already mentioned to him that he might be a good candidate for defibrillator.  He will also be  referred to Dr. Joseph for genetic counseling.  He is a 15-year-old daughter.  More than 50% of 40 min was spent in counseling related to the above         Current medicines are reviewed at length with the patient today .  The patient does not  have concerns regarding medicines.  

## 2018-09-04 NOTE — Telephone Encounter (Signed)
New message   Patient c/o Palpitations:  High priority if patient c/o lightheadedness, shortness of breath, or chest pain  1) How long have you had palpitations/irregular HR/ Afib? Are you having the symptoms now? Patient has had irregular heartbeat on 09/04/2018 and lasted for about 2 hours. Patient states that his hr was around 150 on yesterday but is back to normal today.  2) Are you currently experiencing lightheadedness, SOB or CP? No   3) Do you have a history of afib (atrial fibrillation) or irregular heart rhythm? yes  4) Have you checked your BP or HR? (document readings if available): patient has not checked his hr or blood pressure today.  5) Are you experiencing any other symptoms?tired and fatigued

## 2018-09-05 LAB — TSH: TSH: 1.48 u[IU]/mL (ref 0.450–4.500)

## 2018-09-10 ENCOUNTER — Ambulatory Visit: Payer: BLUE CROSS/BLUE SHIELD | Admitting: Internal Medicine

## 2018-09-10 NOTE — Telephone Encounter (Signed)
Spoke with pt today who has questions regarding the type of ICD he should have placed. We reviewed the 2 different types, sub cutaneous and intervenous ICDs with risk and benefits of each. I advised pt to speak with his sister, who recently had one placed, and her recommendation. I also advised pt I would speak with Dr Graciela Husbands and see if he would contact pt to discuss his questions in more detail.

## 2018-09-11 DIAGNOSIS — I421 Obstructive hypertrophic cardiomyopathy: Secondary | ICD-10-CM

## 2018-09-14 ENCOUNTER — Other Ambulatory Visit: Payer: Self-pay | Admitting: Internal Medicine

## 2018-09-14 ENCOUNTER — Other Ambulatory Visit: Payer: BLUE CROSS/BLUE SHIELD | Admitting: *Deleted

## 2018-09-14 DIAGNOSIS — I421 Obstructive hypertrophic cardiomyopathy: Secondary | ICD-10-CM | POA: Diagnosis not present

## 2018-09-15 LAB — CBC
HEMATOCRIT: 41.8 % (ref 37.5–51.0)
HEMOGLOBIN: 14.3 g/dL (ref 13.0–17.7)
MCH: 30.7 pg (ref 26.6–33.0)
MCHC: 34.2 g/dL (ref 31.5–35.7)
MCV: 90 fL (ref 79–97)
Platelets: 344 10*3/uL (ref 150–450)
RBC: 4.66 x10E6/uL (ref 4.14–5.80)
RDW: 12.7 % (ref 12.3–15.4)
WBC: 6.7 10*3/uL (ref 3.4–10.8)

## 2018-09-15 LAB — BASIC METABOLIC PANEL
BUN/Creatinine Ratio: 17 (ref 9–20)
BUN: 14 mg/dL (ref 6–24)
CALCIUM: 9.3 mg/dL (ref 8.7–10.2)
CO2: 26 mmol/L (ref 20–29)
Chloride: 102 mmol/L (ref 96–106)
Creatinine, Ser: 0.83 mg/dL (ref 0.76–1.27)
GFR calc Af Amer: 125 mL/min/{1.73_m2} (ref >59)
GFR, EST NON AFRICAN AMERICAN: 109 mL/min/{1.73_m2} (ref >59)
Glucose: 93 mg/dL (ref 65–99)
POTASSIUM: 4.2 mmol/L (ref 3.5–5.2)
Sodium: 141 mmol/L (ref 134–144)

## 2018-09-18 ENCOUNTER — Other Ambulatory Visit: Payer: Self-pay

## 2018-09-18 ENCOUNTER — Ambulatory Visit (HOSPITAL_COMMUNITY): Payer: BLUE CROSS/BLUE SHIELD

## 2018-09-18 ENCOUNTER — Encounter (HOSPITAL_COMMUNITY)
Admission: RE | Disposition: A | Discharge status: Home / Self Care | Payer: Self-pay | Source: Ambulatory Visit | Attending: Internal Medicine

## 2018-09-18 ENCOUNTER — Ambulatory Visit (HOSPITAL_COMMUNITY)
Admission: RE | Admit: 2018-09-18 | Discharge: 2018-09-18 | Disposition: A | Discharge status: Home / Self Care | Payer: BLUE CROSS/BLUE SHIELD | Source: Ambulatory Visit | Attending: Internal Medicine | Admitting: Internal Medicine

## 2018-09-18 ENCOUNTER — Ambulatory Visit (HOSPITAL_COMMUNITY): Payer: BLUE CROSS/BLUE SHIELD | Admitting: Anesthesiology

## 2018-09-18 DIAGNOSIS — I422 Other hypertrophic cardiomyopathy: Secondary | ICD-10-CM | POA: Diagnosis not present

## 2018-09-18 DIAGNOSIS — Z4502 Encounter for adjustment and management of automatic implantable cardiac defibrillator: Secondary | ICD-10-CM | POA: Diagnosis not present

## 2018-09-18 DIAGNOSIS — Z880 Allergy status to penicillin: Secondary | ICD-10-CM | POA: Diagnosis not present

## 2018-09-18 DIAGNOSIS — Z9889 Other specified postprocedural states: Secondary | ICD-10-CM | POA: Insufficient documentation

## 2018-09-18 DIAGNOSIS — I10 Essential (primary) hypertension: Secondary | ICD-10-CM | POA: Insufficient documentation

## 2018-09-18 DIAGNOSIS — Z452 Encounter for adjustment and management of vascular access device: Secondary | ICD-10-CM | POA: Diagnosis not present

## 2018-09-18 DIAGNOSIS — Z006 Encounter for examination for normal comparison and control in clinical research program: Secondary | ICD-10-CM | POA: Diagnosis not present

## 2018-09-18 DIAGNOSIS — I472 Ventricular tachycardia: Secondary | ICD-10-CM | POA: Insufficient documentation

## 2018-09-18 DIAGNOSIS — Z79899 Other long term (current) drug therapy: Secondary | ICD-10-CM | POA: Diagnosis not present

## 2018-09-18 DIAGNOSIS — Z95810 Presence of automatic (implantable) cardiac defibrillator: Secondary | ICD-10-CM

## 2018-09-18 HISTORY — PX: SUBQ ICD IMPLANT: EP1223

## 2018-09-18 LAB — SURGICAL PCR SCREEN
MRSA, PCR: NEGATIVE
Staphylococcus aureus: POSITIVE — AB

## 2018-09-18 SURGERY — SUBQ ICD IMPLANT
Anesthesia: Monitor Anesthesia Care

## 2018-09-18 MED ORDER — DEXAMETHASONE SODIUM PHOSPHATE 10 MG/ML IJ SOLN
INTRAMUSCULAR | Status: DC | PRN
  Administered 2018-09-18: 10 mg via INTRAVENOUS

## 2018-09-18 MED ORDER — CHLORHEXIDINE GLUCONATE 4 % EX LIQD
60.0000 mL | Freq: Once | CUTANEOUS | Status: DC
  Filled 2018-09-18: qty 60

## 2018-09-18 MED ORDER — ONDANSETRON HCL 4 MG/2ML IJ SOLN: 4.0000 mg | Freq: Four times a day (QID) | INTRAMUSCULAR | Status: DC | PRN

## 2018-09-18 MED ORDER — ACETAMINOPHEN 325 MG PO TABS: 325.0000 mg | ORAL_TABLET | ORAL | Status: DC | PRN

## 2018-09-18 MED ORDER — MUPIROCIN 2 % EX OINT
TOPICAL_OINTMENT | Freq: Two times a day (BID) | CUTANEOUS | Status: DC
  Administered 2018-09-18: 1 via NASAL

## 2018-09-18 MED ORDER — HEPARIN (PORCINE) IN NACL 1000-0.9 UT/500ML-% IV SOLN
INTRAVENOUS | Status: AC
  Filled 2018-09-18: qty 500

## 2018-09-18 MED ORDER — MIDAZOLAM HCL 2 MG/2ML IJ SOLN
INTRAMUSCULAR | Status: DC | PRN
  Administered 2018-09-18: 2 mg via INTRAVENOUS

## 2018-09-18 MED ORDER — HEPARIN (PORCINE) IN NACL 1000-0.9 UT/500ML-% IV SOLN
INTRAVENOUS | Status: DC | PRN
  Administered 2018-09-18: 500 mL

## 2018-09-18 MED ORDER — PROPOFOL 10 MG/ML IV BOLUS
INTRAVENOUS | Status: DC | PRN
  Administered 2018-09-18: 200 mg via INTRAVENOUS

## 2018-09-18 MED ORDER — ONDANSETRON HCL 4 MG/2ML IJ SOLN
INTRAMUSCULAR | Status: DC | PRN
  Administered 2018-09-18 (×2): 4 mg via INTRAVENOUS

## 2018-09-18 MED ORDER — EPHEDRINE SULFATE-NACL 50-0.9 MG/10ML-% IV SOSY
PREFILLED_SYRINGE | INTRAVENOUS | Status: DC | PRN
  Administered 2018-09-18 (×2): 5 mg via INTRAVENOUS
  Administered 2018-09-18: 10 mg via INTRAVENOUS

## 2018-09-18 MED ORDER — FENTANYL CITRATE (PF) 250 MCG/5ML IJ SOLN
INTRAMUSCULAR | Status: DC | PRN
  Administered 2018-09-18: 50 ug via INTRAVENOUS
  Administered 2018-09-18 (×2): 25 ug via INTRAVENOUS
  Administered 2018-09-18: 100 ug via INTRAVENOUS

## 2018-09-18 MED ORDER — ENOXAPARIN SODIUM 30 MG/0.3ML ~~LOC~~ SOLN: 30.0000 mg | SUBCUTANEOUS | Status: DC

## 2018-09-18 MED ORDER — MUPIROCIN 2 % EX OINT
TOPICAL_OINTMENT | CUTANEOUS | Status: AC
  Administered 2018-09-18: 1 via NASAL
  Filled 2018-09-18: qty 22

## 2018-09-18 MED ORDER — LIDOCAINE 2% (20 MG/ML) 5 ML SYRINGE
INTRAMUSCULAR | Status: DC | PRN
  Administered 2018-09-18: 100 mg via INTRAVENOUS

## 2018-09-18 MED ORDER — BUPIVACAINE HCL (PF) 0.25 % IJ SOLN
INTRAMUSCULAR | Status: DC | PRN
  Administered 2018-09-18: 75 mL

## 2018-09-18 MED ORDER — VANCOMYCIN HCL IN DEXTROSE 1-5 GM/200ML-% IV SOLN
1000.0000 mg | INTRAVENOUS | Status: AC
  Administered 2018-09-18: 1000 mg via INTRAVENOUS

## 2018-09-18 MED ORDER — VANCOMYCIN HCL IN DEXTROSE 1-5 GM/200ML-% IV SOLN
INTRAVENOUS | Status: AC
  Filled 2018-09-18: qty 200

## 2018-09-18 MED ORDER — SODIUM CHLORIDE 0.9 % IV SOLN
INTRAVENOUS | Status: DC
  Administered 2018-09-18 (×2): via INTRAVENOUS

## 2018-09-18 MED ORDER — SODIUM CHLORIDE 0.9 % IV SOLN
INTRAVENOUS | Status: AC
  Filled 2018-09-18: qty 2

## 2018-09-18 MED ORDER — BUPIVACAINE HCL (PF) 0.25 % IJ SOLN
INTRAMUSCULAR | Status: AC
  Filled 2018-09-18: qty 90

## 2018-09-18 MED ORDER — KETOROLAC TROMETHAMINE 30 MG/ML IJ SOLN
30.0000 mg | Freq: Four times a day (QID) | INTRAMUSCULAR | Status: DC | PRN
  Administered 2018-09-18: 30 mg via INTRAVENOUS
  Filled 2018-09-18 (×2): qty 1

## 2018-09-18 MED ORDER — SODIUM CHLORIDE 0.9 % IV SOLN
80.0000 mg | INTRAVENOUS | Status: AC
  Administered 2018-09-18: 80 mg

## 2018-09-18 MED ORDER — FENTANYL CITRATE (PF) 100 MCG/2ML IJ SOLN: 25.0000 ug | INTRAMUSCULAR | Status: DC | PRN

## 2018-09-18 MED ORDER — SODIUM CHLORIDE 0.9 % IV SOLN: INTRAVENOUS | Status: AC

## 2018-09-18 SURGICAL SUPPLY — 7 items
BLANKET WARM UNDERBOD FULL ACC (MISCELLANEOUS) ×3 IMPLANT
CABLE SURGICAL S-101-97-12 (CABLE) ×3 IMPLANT
HEMOSTAT SURGICEL 2X4 FIBR (HEMOSTASIS) ×6 IMPLANT
ICD SUBCU MRI EMBLEM A219 (ICD Generator) ×3 IMPLANT
LEAD SUBQU EMBLEM 3501 (Pacemaker) ×3 IMPLANT
PAD PRO RADIOLUCENT 2001M-C (PAD) ×6 IMPLANT
TRAY PACEMAKER INSERTION (PACKS) ×3 IMPLANT

## 2018-09-18 NOTE — Anesthesia Procedure Notes (Signed)
Procedure Name: LMA Insertion Date/Time: 09/18/2018 1:18 PM Performed by: Adria Dill, CRNA Pre-anesthesia Checklist: Patient identified, Emergency Drugs available, Suction available and Patient being monitored Patient Re-evaluated:Patient Re-evaluated prior to induction Oxygen Delivery Method: Circle system utilized Preoxygenation: Pre-oxygenation with 100% oxygen Induction Type: IV induction LMA: LMA inserted LMA Size: 5.0 Number of attempts: 1 Placement Confirmation: positive ETCO2 and breath sounds checked- equal and bilateral Tube secured with: Tape Dental Injury: Teeth and Oropharynx as per pre-operative assessment

## 2018-09-18 NOTE — Anesthesia Preprocedure Evaluation (Addendum)
Anesthesia Evaluation  Patient identified by MRN, date of birth, ID band Patient awake    Reviewed: Allergy & Precautions, NPO status , Patient's Chart, lab work & pertinent test results  Airway Mallampati: II  TM Distance: >3 FB     Dental   Pulmonary former smoker,    breath sounds clear to auscultation       Cardiovascular hypertension,  Rhythm:Regular Rate:Normal     Neuro/Psych    GI/Hepatic negative GI ROS, Neg liver ROS,   Endo/Other  negative endocrine ROS  Renal/GU negative Renal ROS     Musculoskeletal   Abdominal   Peds  Hematology   Anesthesia Other Findings   Reproductive/Obstetrics                             Anesthesia Physical Anesthesia Plan  ASA: III  Anesthesia Plan: General   Post-op Pain Management:    Induction: Intravenous  PONV Risk Score and Plan: 1 and Treatment may vary due to age or medical condition, Ondansetron, Dexamethasone and Midazolam  Airway Management Planned: Oral ETT and LMA  Additional Equipment:   Intra-op Plan:   Post-operative Plan:   Informed Consent: I have reviewed the patients History and Physical, chart, labs and discussed the procedure including the risks, benefits and alternatives for the proposed anesthesia with the patient or authorized representative who has indicated his/her understanding and acceptance.   Dental advisory given  Plan Discussed with: CRNA and Anesthesiologist  Anesthesia Plan Comments:        Anesthesia Quick Evaluation

## 2018-09-18 NOTE — Transfer of Care (Signed)
Immediate Anesthesia Transfer of Care Note  Patient: Jake Huff  Procedure(s) Performed: Leontine Locket ICD IMPLANT (N/A )  Patient Location: PACU  Anesthesia Type:General  Level of Consciousness: awake, alert  and patient cooperative  Airway & Oxygen Therapy: Patient Spontanous Breathing and Patient connected to nasal cannula oxygen  Post-op Assessment: Report given to RN and Post -op Vital signs reviewed and stable  Post vital signs: Reviewed and stable  Last Vitals:  Vitals Value Taken Time  BP    Temp    Pulse 97 09/18/2018  3:10 PM  Resp 22 09/18/2018  3:10 PM  SpO2 99 % 09/18/2018  3:10 PM  Vitals shown include unvalidated device data.  Last Pain:  Vitals:   09/18/18 1229  PainSc: 0-No pain      Patients Stated Pain Goal: 3 (09/18/18 1104)  Complications: No apparent anesthesia complications

## 2018-09-18 NOTE — Progress Notes (Signed)
Patient has been awake and alert since arrival in cath holding. Chest soreness at 3 to 4. Reports mild nausea. Sites level zero. VS stable.

## 2018-09-18 NOTE — Interval H&P Note (Signed)
History and Physical Interval Note:  09/18/2018 1:10 PM  Jake Huff  has presented today for surgery, with the diagnosis of hypertrophic cardiomyopathy  The various methods of treatment have been discussed with the patient and family. After consideration of risks, benefits and other options for treatment, the patient has consented to  Procedure(s): SUBQ ICD IMPLANT (N/A) as a surgical intervention .  The patient's history has been reviewed, patient examined, no change in status, stable for surgery.  I have reviewed the patient's chart and labs.  Questions were answered to the patient's satisfaction.     Sherryl Manges Reviewed plan with pt and his wife and had spoken previously with his sister

## 2018-09-18 NOTE — Anesthesia Postprocedure Evaluation (Signed)
Anesthesia Post Note  Patient: Jake Huff  Procedure(s) Performed: SUBQ ICD IMPLANT (N/A )     Patient location during evaluation: PACU Anesthesia Type: General Level of consciousness: awake Pain management: pain level controlled Vital Signs Assessment: post-procedure vital signs reviewed and stable Respiratory status: spontaneous breathing Cardiovascular status: stable Postop Assessment: adequate PO intake Anesthetic complications: no    Last Vitals:  Vitals:   09/18/18 1650 09/18/18 1700  BP: 136/89 139/86  Pulse: 83 80  Resp:    Temp:    SpO2: 96% 98%    Last Pain:  Vitals:   09/18/18 1615  PainSc: 3                  Derral Colucci

## 2018-09-18 NOTE — Progress Notes (Signed)
Patient in cath holding pre procedure. Awake, alert, oriented, MAE. No report of pain. Wife at bedside.

## 2018-09-18 NOTE — Progress Notes (Signed)
Pt seen pre discharge CXR and device interrogation normal Reviewed discharge instructions

## 2018-09-18 NOTE — Interval H&P Note (Signed)
ICD Criteria  Current LVEF:65%. Within 12 months prior to implant: Yes   Heart failure history: No  Cardiomyopathy history: Yes, Non-Ischemic Cardiomyopathy.  Atrial Fibrillation/Atrial Flutter: No.  Ventricular tachycardia history: Yes, No hemodynamic instability. VT Type: Non-Sustained Ventricular Tachycardia.  Cardiac arrest history: No.  History of syndromes with risk of sudden death: Yes, Other  Previous ICD: No.  Current ICD indication: Primary  PPM indication: No.  Class I or II Bradycardia indication present: No  Beta Blocker therapy for 3 or more months: Yes, prescribed.   Ace Inhibitor/ARB therapy for 3 or more months: No, medical reason.   I have seen Jake Huff is a 42 y.o. malepre-procedural and has been referred for consideration of ICD implant for primary prevention of sudden death.  The patient's chart has been reviewed and they meet criteria for ICD implant.  I have had a thorough discussion with the patient reviewing options.  The patient and their family (if available) have had opportunities to ask questions and have them answered. The patient and I have decided together through the [facility] Share Decision Support Tool to implant ICD at this time.  Risks, benefits, alternatives to ICD implantation were discussed in detail with the patient today. The patient  understands that the risks include but are not limited to bleeding, infection, pneumothorax, perforation, tamponade, vascular damage, renal failure, MI, stroke, death, inappropriate shocks, and lead dislodgement and wishes to proceed.  History and Physical Interval Note:  09/18/2018 1:11 PM  Jake Huff  has presented today for surgery, with the diagnosis of hypertrophic cardiomyopathy  The various methods of treatment have been discussed with the patient and family. After consideration of risks, benefits and other options for treatment, the patient has consented to  Procedure(s): SUBQ ICD IMPLANT  (N/A) as a surgical intervention .  The patient's history has been reviewed, patient examined, no change in status, stable for surgery.  I have reviewed the patient's chart and labs.  Questions were answered to the patient's satisfaction.     Sherryl Manges

## 2018-09-18 NOTE — Discharge Instructions (Signed)
Wound care instructions No driving for 2 days.  You can remove outer dressing tomorrow. Call the office [phone]) for redness, drainage, swelling, or fever.   Dermabond will come off in next 10-14 days; if not will remove at wound check  Keep wound dry until tomorrow evening No Driving x 2 days Wound check in office as scheduled

## 2018-09-21 ENCOUNTER — Encounter (HOSPITAL_COMMUNITY): Payer: Self-pay | Admitting: Internal Medicine

## 2018-09-22 ENCOUNTER — Other Ambulatory Visit: Payer: Self-pay

## 2018-09-22 DIAGNOSIS — I421 Obstructive hypertrophic cardiomyopathy: Secondary | ICD-10-CM

## 2018-09-22 NOTE — Progress Notes (Signed)
Per Dr Graciela Husbands, pt needs order for a sleep study.

## 2018-09-24 ENCOUNTER — Telehealth: Payer: Self-pay | Admitting: *Deleted

## 2018-09-24 NOTE — Telephone Encounter (Signed)
-----   Message from Oretha Milch, RN sent at 09/22/2018 11:32 AM EST ----- Regarding: Sleep Study Hello  Pt needs a pre auth and to be set up with a sleep study.  Thanks!  Lorren

## 2018-09-24 NOTE — Telephone Encounter (Signed)
Staff message sent to [facility] auth received. Ok to schedule sleep study. Auth # [id number]. Valid dates 09/24/18 to 11/22/18.

## 2018-09-25 ENCOUNTER — Encounter: Payer: Self-pay | Admitting: Internal Medicine

## 2018-09-25 NOTE — Progress Notes (Signed)
Pt has sleep disordered breathing, apnea while sleeping and daytime fatigue  Have recommended sleep study

## 2018-09-26 ENCOUNTER — Other Ambulatory Visit: Payer: Self-pay

## 2018-09-26 ENCOUNTER — Encounter (HOSPITAL_COMMUNITY): Payer: Self-pay

## 2018-09-26 ENCOUNTER — Emergency Department (HOSPITAL_COMMUNITY)
Admission: EM | Admit: 2018-09-26 | Discharge: 2018-09-26 | Disposition: A | Discharge status: Home / Self Care | Payer: BLUE CROSS/BLUE SHIELD | Attending: Emergency Medicine | Admitting: Emergency Medicine

## 2018-09-26 ENCOUNTER — Emergency Department (HOSPITAL_COMMUNITY): Payer: BLUE CROSS/BLUE SHIELD

## 2018-09-26 ENCOUNTER — Telehealth: Payer: Self-pay | Admitting: Cardiology

## 2018-09-26 DIAGNOSIS — I10 Essential (primary) hypertension: Secondary | ICD-10-CM | POA: Diagnosis not present

## 2018-09-26 DIAGNOSIS — S299XXA Unspecified injury of thorax, initial encounter: Secondary | ICD-10-CM | POA: Diagnosis not present

## 2018-09-26 DIAGNOSIS — M545 Low back pain: Secondary | ICD-10-CM | POA: Insufficient documentation

## 2018-09-26 DIAGNOSIS — S3992XA Unspecified injury of lower back, initial encounter: Secondary | ICD-10-CM | POA: Diagnosis not present

## 2018-09-26 DIAGNOSIS — R0789 Other chest pain: Secondary | ICD-10-CM | POA: Insufficient documentation

## 2018-09-26 DIAGNOSIS — Z79899 Other long term (current) drug therapy: Secondary | ICD-10-CM | POA: Diagnosis not present

## 2018-09-26 DIAGNOSIS — Z87891 Personal history of nicotine dependence: Secondary | ICD-10-CM | POA: Insufficient documentation
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
## 2018-09-26 NOTE — Discharge Instructions (Addendum)
Take over-the-counter medications as needed, please review the discharge instructions for additional information

## 2018-09-26 NOTE — ED Notes (Signed)
Patient verbalizes understanding of discharge instructions. Opportunity for questioning and answers were provided. Armband removed by staff, pt discharged from ED.  

## 2018-09-26 NOTE — ED Notes (Signed)
Patient transported to X-ray 

## 2018-09-30 ENCOUNTER — Telehealth: Payer: Self-pay | Admitting: *Deleted

## 2018-09-30 NOTE — Telephone Encounter (Signed)
Patient is scheduled for lab study on 11/17/18. Patient understands his sleep study will be done at [facility] sleep lab. Patient understands he will receive a sleep packet in a week or so. Patient understands to call if he does not receive the sleep packet in a timely manner. Patient agrees with treatment and thanked me for call.

## 2018-09-30 NOTE — Telephone Encounter (Signed)
-----   Message from Gaynelle Cage, CMA sent at 09/24/2018  3:37 PM EST ----- Regarding: RE: Sleep Study [employer] auth received. Ok to schedule sleep study. Auth # [id number]. Valid 09/24/18 to 11/22/18. ----- Message ----- From: Oretha Milch, RN Sent: 09/22/2018  11:32 AM EST To: [facility] Pre Cert/Auth, # Subject: Sleep Study                                    Hello  Pt needs a pre auth and to be set up with a sleep study.  Thanks!  Lorren

## 2018-10-01 ENCOUNTER — Ambulatory Visit (INDEPENDENT_AMBULATORY_CARE_PROVIDER_SITE_OTHER): Payer: BLUE CROSS/BLUE SHIELD | Admitting: *Deleted

## 2018-10-01 DIAGNOSIS — I421 Obstructive hypertrophic cardiomyopathy: Secondary | ICD-10-CM | POA: Diagnosis not present

## 2018-10-01 LAB — CUP PACEART INCLINIC DEVICE CHECK
Date Time Interrogation Session: 20191212162220
Implantable Lead Implant Date: 20191129
Implantable Lead Location: 753862
Implantable Lead Model: 3501
Implantable Lead Serial Number: 163240
Implantable Pulse Generator Implant Date: 20191129
MDC IDC PG SERIAL: 251605

## 2018-10-01 NOTE — Progress Notes (Signed)
Wound SICD check appointment. Dermabond removed. Wound without redness or edema. Incision edges approximated, wound well healed.  0 untreated episodes; 0 treated episodes; 0 shocks delivered. Electrode impedance status okay. No programming changes. Remaining longevity to ERI 100%. ROV w/  SK 12/21/2018. Pt educated about wound care and shock plan.

## 2018-11-09 DIAGNOSIS — H20012 Primary iridocyclitis, left eye: Secondary | ICD-10-CM | POA: Diagnosis not present

## 2018-11-17 ENCOUNTER — Ambulatory Visit (HOSPITAL_BASED_OUTPATIENT_CLINIC_OR_DEPARTMENT_OTHER): Payer: BLUE CROSS/BLUE SHIELD | Attending: Internal Medicine | Admitting: Cardiology

## 2018-11-17 VITALS — Ht 73.0 in | Wt 195.0 lb

## 2018-11-17 DIAGNOSIS — I421 Obstructive hypertrophic cardiomyopathy: Secondary | ICD-10-CM | POA: Insufficient documentation

## 2018-11-17 DIAGNOSIS — G478 Other sleep disorders: Secondary | ICD-10-CM | POA: Diagnosis not present

## 2018-11-22 NOTE — Procedures (Signed)
   Patient Name: Jake Huff, Jake Huff Date: 11/17/2018 Gender: Male D.O.B: 1976/05/30 Age (years): 43 Referring Provider: Sherryl Manges Height (inches): 73 Interpreting Physician: Armanda Magic MD, ABSM Weight (lbs): 195 RPSGT: Ulyess Mort BMI: 26 MRN: [id number] Neck Size: 15.00  CLINICAL INFORMATION Sleep Study Type: NPSG  Indication for sleep study: Snoring  Epworth Sleepiness Score: 9  SLEEP STUDY TECHNIQUE As per the AASM Manual for the Scoring of Sleep and Associated Events v2.3 (April 2016) with a hypopnea requiring 4% desaturations.  The channels recorded and monitored were frontal, central and occipital EEG, electrooculogram (EOG), submentalis EMG (chin), nasal and oral airflow, thoracic and abdominal wall motion, anterior tibialis EMG, snore microphone, electrocardiogram, and pulse oximetry.  MEDICATIONS Medications self-administered by patient taken the night of the study : XANAX  SLEEP ARCHITECTURE The study was initiated at 10:30:37 PM and ended at 4:44:31 AM.  Sleep onset time was 16.8 minutes and the sleep efficiency was 65.8%%. The total sleep time was 246 minutes.  Stage REM latency was 83.5 minutes.  The patient spent 11.6%% of the night in stage N1 sleep, 83.7%% in stage N2 sleep, 0.0%% in stage N3 and 4.7% in REM.  Alpha intrusion was absent.  Supine sleep was 22.18%.  RESPIRATORY PARAMETERS The overall apnea/hypopnea index (AHI) was 2.7 per hour. There were 1 total apneas, including 0 obstructive, 1 central and 0 mixed apneas. There were 10 hypopneas and 47 RERAs.  The AHI during Stage REM sleep was 5.2 per hour.  AHI while supine was 8.8 per hour.  The mean oxygen saturation was 95.1%. The minimum SpO2 during sleep was 90.0%.  moderate snoring was noted during this study.  CARDIAC DATA The 2 lead EKG demonstrated sinus rhythm. The mean heart rate was 73.2 beats per minute. Other EKG findings include: None.  LEG MOVEMENT DATA The total  PLMS were 0 with a resulting PLMS index of 0.0. Associated arousal with leg movement index was 0.0 .  IMPRESSIONS - No significant obstructive sleep apnea occurred during this study (AHI = 2.7/h). - No significant central sleep apnea occurred during this study (CAI = 0.2/h). - The patient had minimal or no oxygen desaturation during the study (Min O2 = 90.0%) - The patient snored with moderate snoring volume. - No cardiac abnormalities were noted during this study. - Clinically significant periodic limb movements did not occur during sleep. No significant associated arousals.  DIAGNOSIS - Upper Airway Resistance Syndrome  RECOMMENDATIONS - There was evidence of upper airway resistance syndrome with an RDI of 14.1/hr which does not qualify for PAP. - Avoid alcohol, sedatives and other CNS depressants that may worsen sleep apnea and disrupt normal sleep architecture. - Sleep hygiene should be reviewed to assess factors that may improve sleep quality. - Weight management and regular exercise should be initiated or continued if appropriate. - Patient should avoid sleeping in the supine position.  [Electronically signed] 11/22/2018 09:55 AM  Armanda Magic MD, ABSM Diplomate, American Board of Sleep Medicine

## 2018-11-24 ENCOUNTER — Telehealth: Payer: Self-pay | Admitting: *Deleted

## 2018-11-24 NOTE — Telephone Encounter (Signed)
-----   Message from Quintella Reichert, MD sent at 11/22/2018  9:58 AM EST ----- Please let patient know that sleep study showed no significant sleep apnea but does have some upper airway resistance to airflow but not enough to qualify for therapy.  Needs to avoid sleeping supine.

## 2018-11-24 NOTE — Telephone Encounter (Signed)
Informed patient of sleep study results and patient understanding was verbalized. Patient understands his sleep study showed no significant sleep apnea but does have some upper airway resistance to airflow but not enough to qualify for therapy. Needs to avoid sleeping supine.  Pt is aware and agreeable to normal results and to avoid sleeping on his back. Patient thanked me for calling.

## 2018-12-03 ENCOUNTER — Encounter: Payer: Self-pay | Admitting: Internal Medicine

## 2018-12-07 ENCOUNTER — Ambulatory Visit: Payer: BLUE CROSS/BLUE SHIELD | Admitting: Internal Medicine

## 2018-12-17 DIAGNOSIS — J309 Allergic rhinitis, unspecified: Secondary | ICD-10-CM | POA: Diagnosis not present

## 2018-12-17 DIAGNOSIS — I10 Essential (primary) hypertension: Secondary | ICD-10-CM | POA: Diagnosis not present

## 2018-12-17 DIAGNOSIS — I2510 Atherosclerotic heart disease of native coronary artery without angina pectoris: Secondary | ICD-10-CM | POA: Diagnosis not present

## 2018-12-17 DIAGNOSIS — I421 Obstructive hypertrophic cardiomyopathy: Secondary | ICD-10-CM | POA: Diagnosis not present

## 2018-12-21 ENCOUNTER — Encounter: Payer: Self-pay | Admitting: Internal Medicine

## 2018-12-21 ENCOUNTER — Ambulatory Visit (INDEPENDENT_AMBULATORY_CARE_PROVIDER_SITE_OTHER): Payer: BLUE CROSS/BLUE SHIELD | Admitting: Internal Medicine

## 2018-12-21 VITALS — BP 120/86 | HR 86 | Ht 73.0 in | Wt 193.8 lb

## 2018-12-21 DIAGNOSIS — I471 Supraventricular tachycardia: Secondary | ICD-10-CM | POA: Diagnosis not present

## 2018-12-21 DIAGNOSIS — I421 Obstructive hypertrophic cardiomyopathy: Secondary | ICD-10-CM

## 2018-12-21 DIAGNOSIS — Z95810 Presence of automatic (implantable) cardiac defibrillator: Secondary | ICD-10-CM | POA: Diagnosis not present

## 2018-12-21 NOTE — Progress Notes (Signed)
Patient Care Team: Rinaldo Cloud, MD as PCP - General (Cardiology)   HPI  Jake Huff is a 43 y.o. male Seen in follow-up for hypertrophic cardia myopathy recently diagnosed and recurrent tachypalpitations. He is s/p SICD implant     He has been having episodes of tachypalpitations.  He was given a ZIO Patch.  This demonstrated sinus tachycardia.  It also demonstrated recurrent episodes of nonsustained ventricular tachycardia.  Continues to have episodes of tachypalpitations with little activity.  These are often abrupt in onset.  These are much improved with his metoprolol and further up titration from 50--100.   His sister just underwent myectomy and defibrillator implantation at [facility] testing was positive for myosin binding protein mutation  Records and Results Reviewed he also has an appointment to see soon  Past Medical History:  Diagnosis Date  . Hypertension   . Hypertrophic cardiomyopathy (HCC)   . Sinus tachycardia ? autonomic   . SVT (supraventricular tachycardia) (HCC) s/p AV nodal modification     Past Surgical History:  Procedure Laterality Date  . FINGER SURGERY Left    ring finger  . SUBQ ICD IMPLANT N/A 09/18/2018   Procedure: SUBQ ICD IMPLANT;  Surgeon: Duke Salvia, MD;  Location: [facility] INVASIVE CV LAB;  Service: Cardiovascular;  Laterality: N/A;    Current Meds  Medication Sig  . ALPRAZolam (XANAX) 0.25 MG tablet Take 0.375 mg by mouth 2 (two) times daily as needed for anxiety.   . cetirizine (ZYRTEC) 10 MG tablet Take 10 mg by mouth daily as needed for allergies.   . fluticasone (FLONASE) 50 MCG/ACT nasal spray Place 1 spray into both nostrils daily as needed for allergies or rhinitis.  Marland Kitchen ibuprofen (ADVIL,MOTRIN) 200 MG tablet Take 400-600 mg by mouth daily as needed for headache or moderate pain.  . Melatonin 5 MG CAPS Take 5 mg by mouth at bedtime as needed (sleep).  . metoprolol succinate (TOPROL-XL) 100 MG 24 hr tablet  Take 1 tablet (100 mg total) by mouth daily. Take with or immediately following a meal.  . omeprazole (PRILOSEC) 40 MG capsule Take 40 mg by mouth every evening.  . propranolol (INDERAL) 20 MG tablet Take 1 tablet (20 mg total) by mouth every 4 (four) hours as needed (for tachy palpitations).    Allergies  Allergen Reactions  . Penicillins Anaphylaxis, Hives and Swelling    Has patient had a PCN reaction causing immediate rash, facial/tongue/throat swelling, SOB or lightheadedness with hypotension: Yes Has patient had a PCN reaction causing severe rash involving mucus membranes or skin necrosis: No Has patient had a PCN reaction that required hospitalization: No Has patient had a PCN reaction occurring within the last 10 years: No If all of the above answers are "NO", then may proceed with Cephalosporin use.       Review of Systems negative except from HPI and PMH  Physical Exam BP 120/86   Pulse 86   Ht 6\' 1"  (1.854 m)   Wt 193 lb 12.8 oz (87.9 kg)   SpO2 97%   BMI 25.57 kg/m  Well developed and well nourished in no acute distress HENT normal Neck supple with JVP-flat Clear Device pocket well healed; without hematoma or erythema.  There is no tethering  Regular rate and rhythm, no   murmur Abd-soft with active BS No Clubbing cyanosis edema Skin-warm and dry A & Oriented  Grossly normal sensory and motor function   ECG demonstrates sinus @  86 13/10/38  MRI was reviewed with the patient.  He has focal septal hypertrophy.  Assessment and  Plan  Hypertrophic cardiomyopathy with gadolinium enhancement  VT nonsustained  SICD The patient's device was interrogated.  The information was reviewed. No changes were made in the programming.     Sinus tachycardia doubt atrial tachycardia probably autonomic    continues to have spells of tachycardia, prob similar to what has been noted previously   Will use ALIVECor to try clarify mechanism        Current medicines  are reviewed at length with the patient today .  The patient does not  have concerns regarding medicines.

## 2018-12-21 NOTE — Patient Instructions (Signed)
Medication Instructions:  Your physician recommends that you continue on your current medications as directed. Please refer to the Current Medication list given to you today.  Labwork: None ordered.  Testing/Procedures: None ordered.  Follow-Up: Your physician recommends that you schedule a follow-up appointment in:   6 weeks with Dr Graciela Husbands  Any Other Special Instructions Will Be Listed Below (If Applicable).    AliveCor  FDA-cleared EKG at your fingertips. - [employer]   [employer], [employer]. https://store.alivecor.com/products/kardiamobile   FDA-cleared, clinical grade mobile EKG monitor: Lourena Simmonds is the most clinically-validated mobile EKG used by the world's leading cardiac care medical professionals.  This may be useful in monitoring palpitations.  We do not have access to have them emailed and reviewed but will be glad to review while in the office.      If you need a refill on your cardiac medications before your next appointment, please call your pharmacy.

## 2018-12-30 LAB — CUP PACEART INCLINIC DEVICE CHECK
Date Time Interrogation Session: 20200311154030
Implantable Lead Implant Date: 20191129
Implantable Lead Location: 753862
Implantable Lead Model: 3501
Implantable Lead Serial Number: 163240
MDC IDC PG IMPLANT DT: 20191129
Pulse Gen Serial Number: 251605

## 2019-01-07 ENCOUNTER — Other Ambulatory Visit: Payer: Self-pay

## 2019-01-07 MED ORDER — METOPROLOL SUCCINATE ER 100 MG PO TB24: ORAL_TABLET | ORAL | 3 refills | Status: DC

## 2019-01-11 ENCOUNTER — Telehealth: Payer: Self-pay

## 2019-01-11 MED ORDER — METOPROLOL SUCCINATE ER 50 MG PO TB24: 150.0000 mg | ORAL_TABLET | Freq: Every day | ORAL | 3 refills | Status: DC

## 2019-01-11 NOTE — Telephone Encounter (Signed)
See My chart message concerning pt's medication refill.

## 2019-02-02 ENCOUNTER — Other Ambulatory Visit: Payer: Self-pay

## 2019-02-02 ENCOUNTER — Telehealth (INDEPENDENT_AMBULATORY_CARE_PROVIDER_SITE_OTHER): Payer: BLUE CROSS/BLUE SHIELD | Admitting: Internal Medicine

## 2019-02-02 VITALS — BP 126/78 | HR 81 | Ht 73.0 in | Wt 190.0 lb

## 2019-02-02 DIAGNOSIS — I471 Supraventricular tachycardia: Secondary | ICD-10-CM

## 2019-02-02 DIAGNOSIS — Z95810 Presence of automatic (implantable) cardiac defibrillator: Secondary | ICD-10-CM

## 2019-02-02 DIAGNOSIS — I421 Obstructive hypertrophic cardiomyopathy: Secondary | ICD-10-CM

## 2019-02-02 DIAGNOSIS — R000 Tachycardia, unspecified: Secondary | ICD-10-CM

## 2019-02-02 NOTE — Addendum Note (Signed)
Addended by: Oretha Milch on: 02/02/2019 05:21 PM   Modules accepted: Orders

## 2019-02-02 NOTE — Progress Notes (Signed)
Electrophysiology TeleHealth Note   Due to national recommendations of social distancing due to COVID 19, an audio/video telehealth visit is felt to be most appropriate for this patient at this time.  See MyChart message from today for the patient's consent to telehealth for [facility].   Date:  02/02/2019   ID:  [name], DOB 1976-09-03, MRN [id number]  Location: patient's home  Provider location: [address] [place]  Evaluation Performed: Follow-up visit  PCP:  Rinaldo Cloud, MD  Cardiologist:   *  Electrophysiologist:  SK   Chief Complaint:  Hypertrophic cardiomyopathy  History of Present Illness:    Jake Huff is a 43 y.o. male who presents via audio/video conferencing for a telehealth visit today.  Since last being seen in our clinic, the patient reports doing better  He has HCM and is s/p SICD implant 1/20  Hx of exercise palpitations improved w metoprolol and further with uptitiration 50>>100 there is been further up titration to 100/50 daily and he continues to feel better.  He is not having nocturnal palpitations.  He continues to have episodes while walking where he gets suddenly weak.  The spells can also occur while eating particularly with a large meal.  Reviewing his echo report he had a mild increase in outflow gradient to less than 20 mm with Valsalva.  Sinus rhythm confirmed with Zio and AliveCor  The patient denies symptoms of fevers, chills, cough, or new SOB worrisome for COVID 19.   Past Medical History:  Diagnosis Date  . Hypertension   . Hypertrophic cardiomyopathy (HCC)   . Sinus tachycardia ? autonomic   . SVT (supraventricular tachycardia) (HCC) s/p AV nodal modification     Past Surgical History:  Procedure Laterality Date  . FINGER SURGERY Left    ring finger  . SUBQ ICD IMPLANT N/A 09/18/2018   Procedure: SUBQ ICD IMPLANT;  Surgeon: Duke Salvia, MD;  Location: [facility] INVASIVE CV LAB;  Service: Cardiovascular;   Laterality: N/A;    Current Outpatient Medications  Medication Sig Dispense Refill  . Acetylcysteine (N-ACETYL-L-CYSTEINE) 600 MG CAPS Take 600 mg by mouth.    . ALPRAZolam (XANAX) 0.25 MG tablet Take 0.375 mg by mouth 2 (two) times daily as needed for anxiety.     Marland Kitchen b complex vitamins capsule Take 1 capsule by mouth daily.    . cetirizine (ZYRTEC) 10 MG tablet Take 10 mg by mouth daily as needed for allergies.     Marland Kitchen ELDERBERRY PO Take by mouth.    . fluticasone (FLONASE) 50 MCG/ACT nasal spray Place 1 spray into both nostrils daily as needed for allergies or rhinitis.    . metoprolol succinate (TOPROL-XL) 50 MG 24 hr tablet Take 3 tablets (150 mg total) by mouth daily. Take 100mg  in the AM and 50mg  in the PM 270 tablet 3  . omeprazole (PRILOSEC) 40 MG capsule Take 40 mg by mouth every evening.    . propranolol (INDERAL) 20 MG tablet Take 1 tablet (20 mg total) by mouth every 4 (four) hours as needed (for tachy palpitations). 90 tablet 3   No current facility-administered medications for this visit.     Allergies:   Penicillins   Social History:  The patient  reports that he quit smoking about 10 years ago. He has never used smokeless tobacco. He reports previous alcohol use. He reports that he does not use drugs.   Family History:  The patient's   family history is  not on file.   ROS:  Please see the history of present illness.   All other systems are personally reviewed and negative.    Exam:    Vital Signs:  BP 126/78   Pulse 81   Ht 6\' 1"  (1.854 m)   Wt 190 lb (86.2 kg)   BMI 25.07 kg/m     Device pocket well healed; without hematoma or erythema.  There is no tethering  Well appearing, alert and conversant, regular work of breathing,  good skin color Eyes- anicteric, neuro- grossly intact, skin- no apparent rash or lesions or cyanosis, mouth- oral mucosa is pink   Labs/Other Tests and Data Reviewed:    Recent Labs: 09/04/2018: TSH 1.480 09/14/2018: BUN 14; Creatinine,  Ser 0.83; Hemoglobin 14.3; Platelets 344; Potassium 4.2; Sodium 141   Wt Readings from Last 3 Encounters:  02/02/19 190 lb (86.2 kg)  12/21/18 193 lb 12.8 oz (87.9 kg)  11/17/18 195 lb (88.5 kg)     Other studies personally reviewed: Additional studies/ records that were reviewed today include: echo and MRI    The patient presents wearable device technology report for my review today. On my review AliveCor tracings demonstrated sinus rhythm with rates up to the 140s.     ASSESSMENT & PLAN:    Hypertrophic cardiomyopathy  ICD-subcutaneous  Dysautonomia--Sinus tachycardia  Ventricular tachycardia-nonsustained  Patient continues to be significantly less symptomatic with higher dose of beta-blockers.  We reviewed the physiology and that makes me wonder whether he does not have some modest exercise associated obstruction.  We will plan a stress echo following COVID-19  We also reviewed the potential related physiology to postprandial symptoms; I would think in the context of obstruction splanchnic shunting could be an issue.  Again the stress echo might be illuminating.  He is advised to remain well-hydrated.  We spent more than 50% of our >25 min visit in face to face counseling regarding the above     COVID 19 screen The patient denies symptoms of COVID 19 at this time.  The importance of social distancing was discussed today.  Follow-up:  75m Next remote: 38m Current medicines are reviewed at length with the patient today.   The patient does not have concerns regarding his medicines.  The following changes were made today:  none  Labs/ tests ordered today include:   No orders of the defined types were placed in this encounter.   Future tests ( post COVID )  *stress echo  in 2  months  Patient Risk:  after full review of this patients clinical status, I feel that they are at moderate risk at this time.  Today, I have spent 27  minutes with the patient with telehealth  technology discussing the above.  Signed, Sherryl Manges, MD  02/02/2019 4:33 PM     [facility] [address] [place] [address] [phone] (office) [phone] (fax)

## 2019-02-09 ENCOUNTER — Ambulatory Visit (INDEPENDENT_AMBULATORY_CARE_PROVIDER_SITE_OTHER): Payer: BLUE CROSS/BLUE SHIELD | Admitting: *Deleted

## 2019-02-09 ENCOUNTER — Other Ambulatory Visit: Payer: Self-pay

## 2019-02-09 DIAGNOSIS — I471 Supraventricular tachycardia: Secondary | ICD-10-CM | POA: Diagnosis not present

## 2019-02-09 DIAGNOSIS — R000 Tachycardia, unspecified: Secondary | ICD-10-CM

## 2019-02-09 LAB — CUP PACEART REMOTE DEVICE CHECK
Battery Remaining Percentage: 96 %
Date Time Interrogation Session: 20200420163100
Implantable Lead Implant Date: 20191129
Implantable Lead Location: 753862
Implantable Lead Model: 3501
Implantable Lead Serial Number: 163240
Implantable Pulse Generator Implant Date: 20191129
Pulse Gen Serial Number: 251605

## 2019-02-16 DIAGNOSIS — H43813 Vitreous degeneration, bilateral: Secondary | ICD-10-CM | POA: Diagnosis not present

## 2019-02-16 DIAGNOSIS — H44111 Panuveitis, right eye: Secondary | ICD-10-CM | POA: Diagnosis not present

## 2019-02-16 DIAGNOSIS — H2513 Age-related nuclear cataract, bilateral: Secondary | ICD-10-CM | POA: Diagnosis not present

## 2019-02-16 NOTE — Progress Notes (Signed)
Remote ICD transmission.   

## 2019-03-02 DIAGNOSIS — H44111 Panuveitis, right eye: Secondary | ICD-10-CM | POA: Diagnosis not present

## 2019-03-02 DIAGNOSIS — M459 Ankylosing spondylitis of unspecified sites in spine: Secondary | ICD-10-CM | POA: Diagnosis not present

## 2019-03-02 DIAGNOSIS — H2513 Age-related nuclear cataract, bilateral: Secondary | ICD-10-CM | POA: Diagnosis not present

## 2019-03-31 DIAGNOSIS — I421 Obstructive hypertrophic cardiomyopathy: Secondary | ICD-10-CM

## 2019-04-14 ENCOUNTER — Emergency Department (HOSPITAL_COMMUNITY): Payer: BC Managed Care – PPO

## 2019-04-14 ENCOUNTER — Encounter (HOSPITAL_COMMUNITY): Payer: Self-pay | Admitting: Emergency Medicine

## 2019-04-14 ENCOUNTER — Other Ambulatory Visit: Payer: Self-pay

## 2019-04-14 ENCOUNTER — Emergency Department (HOSPITAL_COMMUNITY)
Admission: EM | Admit: 2019-04-14 | Discharge: 2019-04-14 | Disposition: A | Discharge status: Home / Self Care | Payer: BC Managed Care – PPO | Attending: Emergency Medicine | Admitting: Emergency Medicine

## 2019-04-14 ENCOUNTER — Telehealth: Payer: Self-pay | Admitting: Internal Medicine

## 2019-04-14 DIAGNOSIS — Z79899 Other long term (current) drug therapy: Secondary | ICD-10-CM | POA: Diagnosis not present

## 2019-04-14 DIAGNOSIS — R002 Palpitations: Secondary | ICD-10-CM

## 2019-04-14 DIAGNOSIS — R42 Dizziness and giddiness: Secondary | ICD-10-CM | POA: Diagnosis not present

## 2019-04-14 DIAGNOSIS — I10 Essential (primary) hypertension: Secondary | ICD-10-CM | POA: Insufficient documentation

## 2019-04-14 DIAGNOSIS — R110 Nausea: Secondary | ICD-10-CM | POA: Insufficient documentation

## 2019-04-14 DIAGNOSIS — R079 Chest pain, unspecified: Secondary | ICD-10-CM | POA: Diagnosis not present

## 2019-04-14 DIAGNOSIS — R531 Weakness: Secondary | ICD-10-CM | POA: Insufficient documentation

## 2019-04-14 DIAGNOSIS — Z87891 Personal history of nicotine dependence: Secondary | ICD-10-CM | POA: Diagnosis not present

## 2019-04-14 DIAGNOSIS — R0789 Other chest pain: Secondary | ICD-10-CM | POA: Insufficient documentation

## 2019-04-14 DIAGNOSIS — Z880 Allergy status to penicillin: Secondary | ICD-10-CM | POA: Diagnosis not present

## 2019-04-14 LAB — BASIC METABOLIC PANEL
Anion gap: 12 (ref 5–15)
BUN: 9 mg/dL (ref 6–20)
CO2: 24 mmol/L (ref 22–32)
Calcium: 9.7 mg/dL (ref 8.9–10.3)
Chloride: 104 mmol/L (ref 98–111)
Creatinine, Ser: 0.94 mg/dL (ref 0.61–1.24)
GFR calc Af Amer: >60 mL/min (ref >60)
GFR calc non Af Amer: >60 mL/min (ref >60)
Glucose, Bld: 111 mg/dL — ABNORMAL HIGH (ref 70–99)
Potassium: 4.1 mmol/L (ref 3.5–5.1)
Sodium: 140 mmol/L (ref 135–145)

## 2019-04-14 LAB — CBC
HCT: 46.3 % (ref 39.0–52.0)
Hemoglobin: 15.7 g/dL (ref 13.0–17.0)
MCH: 31.2 pg (ref 26.0–34.0)
MCHC: 33.9 g/dL (ref 30.0–36.0)
MCV: 92 fL (ref 80.0–100.0)
Platelets: 297 10*3/uL (ref 150–400)
RBC: 5.03 MIL/uL (ref 4.22–5.81)
RDW: 11.6 % (ref 11.5–15.5)
WBC: 8.9 10*3/uL (ref 4.0–10.5)
nRBC: 0 % (ref 0.0–0.2)

## 2019-04-14 LAB — BRAIN NATRIURETIC PEPTIDE: B Natriuretic Peptide: 76.1 pg/mL (ref 0.0–100.0)

## 2019-04-14 LAB — SEDIMENTATION RATE: Sed Rate: 2 mm/hr (ref 0–16)

## 2019-04-14 LAB — TROPONIN I (HIGH SENSITIVITY)
Troponin I (High Sensitivity): 5 ng/L (ref <18)
Troponin I (High Sensitivity): 4 ng/L (ref <18)

## 2019-04-14 LAB — C-REACTIVE PROTEIN: CRP: <0.8 mg/dL (ref <1.0)

## 2019-04-14 MED ORDER — MORPHINE SULFATE (PF) 4 MG/ML IV SOLN
4.0000 mg | Freq: Once | INTRAVENOUS | Status: DC
  Filled 2019-04-14: qty 1

## 2019-04-14 MED ORDER — KETOROLAC TROMETHAMINE 30 MG/ML IJ SOLN
30.0000 mg | Freq: Once | INTRAMUSCULAR | Status: AC
  Administered 2019-04-14: 30 mg via INTRAVENOUS
  Filled 2019-04-14: qty 1

## 2019-04-14 MED ORDER — SODIUM CHLORIDE 0.9% FLUSH: 3.0000 mL | Freq: Once | INTRAVENOUS | Status: DC

## 2019-04-14 MED ORDER — ONDANSETRON HCL 4 MG/2ML IJ SOLN
4.0000 mg | Freq: Once | INTRAMUSCULAR | Status: DC
  Filled 2019-04-14: qty 2

## 2019-04-14 NOTE — Discharge Instructions (Signed)
Cardiologist Dr. Olin Pia office will contact you with close follow up.  Return to the ER if your condition worsen or if you have other concerns.

## 2019-04-14 NOTE — ED Notes (Signed)
[employer] rep has not called back Network engineer will call again.

## 2019-04-14 NOTE — ED Notes (Signed)
Unable to interrogate ACID secretary will page rep.

## 2019-04-14 NOTE — ED Triage Notes (Signed)
Onset one day ago while at work walked around for 20 minutes then sat down developed palpitations. 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

## 2019-04-14 NOTE — Telephone Encounter (Signed)
Pt calling today with c/o 7/10 CP for the last (approx) 16 hrs. He states yesterday he was working in the yard, felt overheated. Went inside and began to feel nauseated with palpitations. He took a dose of propranolol and felt better. Before bed his chest started hurting again and he woke up in a sweat. He continues to feel nauseous, fatigued, and has chest heaviness. He denies SOB or fever at this time.  I have advised him to go to the ED for evaluation of CP. Pt has agreed. I asked him to have someone drive him or to call EMS for transportation, he should not drive himself. He agrees and will go to the ED.

## 2019-04-14 NOTE — ED Notes (Signed)
Attempted to interrogate AICD.

## 2019-04-14 NOTE — ED Notes (Signed)
Spoke with Network engineer paged [employer] twice will page a third time.

## 2019-04-14 NOTE — ED Provider Notes (Signed)
[facility] EMERGENCY DEPARTMENT Provider Note   CSN: [id number] Arrival date & time: 04/14/19  0946     History   Chief Complaint Chief Complaint  Patient presents with  . Palpitations    HPI Jake Huff is a 43 y.o. male.     The history is provided by the patient and medical records. No language interpreter was used.  Palpitations    43 year old male with history of hypertrophic cardiomyopathy, SVT, hypertension, presenting for evaluation of heart palpitation.  Patient report yesterday afternoon he went outside for a walk lasting for approximately 15 minutes.  Afterward, he felt lightheadedness, generalized weakness, dizzy, broke out into a sweat and felt nauseous.  He took his propranolol and states symptoms did improved.  Did report some palpitation at that time.  Last night, he developed sharp left-sided chest pain, felt nauseous, and weak.  Went to sleep woke up today and still experiencing some chest discomfort and states he was drenched in sweat.  It has since improved but not fully resolved.  He did take his usual medication.  He has an AICD implant 6 months ago for VT.  History of hypertrophic cardiomyopathy.  He denies any change in his diet of his medication.  Denies any increasing stress.  No report of fever, productive cough, hemoptysis, abdominal pain or back pain.  Past Medical History:  Diagnosis Date  . Hypertension   . Hypertrophic cardiomyopathy (HCC)   . Sinus tachycardia ? autonomic   . SVT (supraventricular tachycardia) (HCC) s/p AV nodal modification     Patient Active Problem List   Diagnosis Date Noted  . HYPERCHOLESTEROLEMIA 11/30/2009  . SUPRAVENTRICULAR TACHYCARDIA, HX OF 11/30/2009  . CHEST PAIN, ATYPICAL, HX OF 11/30/2009    Past Surgical History:  Procedure Laterality Date  . FINGER SURGERY Left    ring finger  . SUBQ ICD IMPLANT N/A 09/18/2018   Procedure: SUBQ ICD IMPLANT;  Surgeon: Duke Salvia, MD;  Location: [facility]  INVASIVE CV LAB;  Service: Cardiovascular;  Laterality: N/A;        Home Medications    Prior to Admission medications   Medication Sig Start Date End Date Taking? Authorizing Provider  Acetylcysteine (N-ACETYL-L-CYSTEINE) 600 MG CAPS Take 600 mg by mouth.    [provider]  ALPRAZolam Prudy Feeler) 0.25 MG tablet Take 0.375 mg by mouth 2 (two) times daily as needed for anxiety.     [provider]  b complex vitamins capsule Take 1 capsule by mouth daily.    [provider]  cetirizine (ZYRTEC) 10 MG tablet Take 10 mg by mouth daily as needed for allergies.     [provider]  ELDERBERRY PO Take by mouth.    [provider]  fluticasone (FLONASE) 50 MCG/ACT nasal spray Place 1 spray into both nostrils daily as needed for allergies or rhinitis.    [provider]  metoprolol succinate (TOPROL-XL) 50 MG 24 hr tablet Take 3 tablets (150 mg total) by mouth daily. Take 100mg  in the AM and 50mg  in the PM 01/11/19   Duke Salvia, MD  omeprazole (PRILOSEC) 40 MG capsule Take 40 mg by mouth every evening.    [provider]  propranolol (INDERAL) 20 MG tablet Take 1 tablet (20 mg total) by mouth every 4 (four) hours as needed (for tachy palpitations). 09/04/18   Duke Salvia, MD    Family History No family history on file.  Social History Social History   Tobacco Use  .  Smoking status: Former Smoker    Quit date: 12/05/2008    Years since quitting: 10.3  . Smokeless tobacco: Never Used  Substance Use Topics  . Alcohol use: Yes  . Drug use: Never     Allergies   Penicillins   Review of Systems Review of Systems  Cardiovascular: Positive for palpitations.  All other systems reviewed and are negative.    Physical Exam Updated Vital Signs BP (!) 148/94 (BP Location: Right Arm)   Pulse 76   Temp 98.6 F (37 C) (Oral)   Resp 17   Ht 6\' 1"  (1.854 m)   Wt 86 kg   SpO2 99%   BMI 25.01 kg/m   Physical Exam  Vitals signs and nursing note reviewed.  Constitutional:      General: He is not in acute distress.    Appearance: He is well-developed.  HENT:     Head: Atraumatic.  Eyes:     Conjunctiva/sclera: Conjunctivae normal.  Neck:     Musculoskeletal: Neck supple.  Cardiovascular:     Rate and Rhythm: Normal rate and regular rhythm.     Pulses: Normal pulses.     Heart sounds: Normal heart sounds.  Pulmonary:     Effort: Pulmonary effort is normal.     Breath sounds: Normal breath sounds. No wheezing, rhonchi or rales.  Abdominal:     Palpations: Abdomen is soft.     Tenderness: There is no abdominal tenderness.  Musculoskeletal:        General: No swelling.  Skin:    Capillary Refill: Capillary refill takes less than 2 seconds.     Findings: No rash.  Neurological:     General: No focal deficit present.     Mental Status: He is alert and oriented to person, place, and time.  Psychiatric:        Mood and Affect: Mood normal.      ED Treatments / Results  Labs (all labs ordered are listed, but only abnormal results are displayed) Labs Reviewed  BASIC METABOLIC PANEL - Abnormal; Notable for the following components:      Result Value   Glucose, Bld 111 (*)    All other components within normal limits  CBC  TROPONIN I (HIGH SENSITIVITY)  TROPONIN I (HIGH SENSITIVITY)  SEDIMENTATION RATE  C-REACTIVE PROTEIN  BRAIN NATRIURETIC PEPTIDE    EKG None  ED ECG REPORT   Date: 04/14/2019  Rate: 78  Rhythm: normal sinus rhythm  QRS Axis: normal  Intervals: normal  ST/T Wave abnormalities: nonspecific ST/T changes  Conduction Disutrbances:none  Narrative Interpretation:   Old EKG Reviewed: unchanged  I have personally reviewed the EKG tracing and agree with the computerized printout as noted.   Radiology Dg Chest 2 View  Result Date: 04/14/2019 CLINICAL DATA:  Chest pain EXAM: CHEST - 2 VIEW COMPARISON:  05/09/2018 FINDINGS: There is no focal consolidation. There is  no pleural effusion or pneumothorax. The heart and mediastinal contours are unremarkable. There is subcutaneous ICD implant noted. There is no acute osseous abnormality. IMPRESSION: No active cardiopulmonary disease. Electronically Signed   By: Kathreen Devoid   On: 04/14/2019 10:56    Procedures Procedures (including critical care time)  Medications Ordered in ED Medications  sodium chloride flush (NS) 0.9 % injection 3 mL ( Intravenous Canceled Entry 04/14/19 1604)  morphine 4 MG/ML injection 4 mg (0 mg Intravenous Hold 04/14/19 1541)  ondansetron (ZOFRAN) injection 4 mg (0 mg Intravenous Hold 04/14/19 1541)  ketorolac (  TORADOL) 30 MG/ML injection 30 mg (30 mg Intravenous Given 04/14/19 1730)     Initial Impression / Assessment and Plan / ED Course  I have reviewed the triage vital signs and the nursing notes.  Pertinent labs & imaging results that were available during my care of the patient were reviewed by me and considered in my medical decision making (see chart for details).        BP (!) 148/94 (BP Location: Right Arm)   Pulse 76   Temp 98.6 F (37 C) (Oral)   Resp 17   Ht 6\' 1"  (1.854 m)   Wt 86 kg   SpO2 99%   BMI 25.01 kg/m    Final Clinical Impressions(s) / ED Diagnoses   Final diagnoses:  None    ED Discharge Orders    None     11:30 AM Patient with history of SVT, has an AICD, here with chest discomfort and heart palpitation since yesterday.  Pain his chest is minimal at this time.  Plan to obtain basic labs, EKG, chest x-ray, and interrogate his device.  1:19 PM  Patient report his sisters as the same history of hypertrophic cardiomyopathy with subsequent complication from insertions of AICD requiring extensive work-up.  In the setting of chest pain, the family requests to have Sed Rate, CRP, BNP obtained as well as echocardiogram.  Currently, we are having difficulty interrogating his AICD, will continue trying.  Labs otherwise reassuring, EKG without  concerning SVT or abnormal cardiac rhythm.  Chest x-ray unremarkable.  5:07 PM Sed rate, CRP, and BNP are within normal limit.  Delta troponin unremarkable.  Patient still endorsing this comfort in his chest and requesting for Toradol as he declined morphine and states that opiate medication caused palpitation for him in the past.  Currently awaits representative from [employer] to interrogate his AICD.  5:37 PM AICD was interrogated, no concerning activity.  Will consult cardiology for further evaluation.   5:43 PM Appreciate consultation from cardiologist Dr. Allyson SabalBerry who will have his office contact pt to schedule outpt f/u with Dr. Graciela HusbandsKlein for further care.    Fayrene Helperran, Alyah Boehning, PA-C 04/14/19 1758    Sabas SousBero, Michael M, MD 04/14/19 [phone]

## 2019-04-14 NOTE — Telephone Encounter (Signed)
New Message          Pt c/o of Chest Pain: 1. Are you having CP right now? Yes 2. Are you experiencing any other symptoms (ex. SOB, nausea, vomiting, sweating)? Nausea 3. How long have you been experiencing CP? Last night 4. Is your CP continuous or coming and going? Continuous 5. Have you taken Nitroglycerin? No

## 2019-04-14 NOTE — ED Notes (Signed)
Pt states he doesn't want or really need pain and nausea med at this time

## 2019-04-14 NOTE — Telephone Encounter (Signed)
Noted  

## 2019-04-15 NOTE — Telephone Encounter (Signed)
Called pt to discuss his recent ER visit. He was concerned with his High Sensitivity Troponin I level of 4. I explained to him a normal Troponin is measured in ng/mL, but the high sensitivity is measured in ng/L and his level was within the normal range.  He states today he still has some mild CP but much relieved from yesterday. Overall he was displeased with his ER visit. He states someone came into his room to discuss his EKG stating it was "different" from his last one but could not explain how or why. He was never seen by a cardiologist. He states the ED staff was more concerned with interrogating his device rather than focusing on his CP.  I apologized for his experience as this is not typical of [facility] ED. I told him I would forward his message to Dr Caryl Comes to review, specifically his EKG tracings. He understands Dr Caryl Comes is out of the office until July 8 and it may take a little while for him to respond.   Lastly, pt would like to have his stress echo done asap. He understands the COVID restrictions, but hopes there may be a solution in the near future. I have reassured him he is on my priority list to get scheduled and as soon as there is a plan to begin stress tests, he will be one of the first schedule.  He has verbalized understanding and has no additional needs at this time.

## 2019-05-10 NOTE — Progress Notes (Signed)
Patient Care Team: Charolette Forward, MD as PCP - General (Cardiology)   HPI  Jake Huff is a 43 y.o. male Seen in follow-up for hypertrophic cardia myopathy recently diagnosed and recurrent tachypalpitations. He is s/p SICD implant     Recurrent tachypalpitations >> ZIO Patch-->>> demonstrated sinus tachycardia. also  recurrent nonsustained ventricular tachycardia.  Tachypalpitations still with little effort but now much improved as is his heat tolerance on the recetnly uptitrated metoprolol 150>>200   Also uses prn inderal  Most recently abrupt onset tachypalps while sitting and then abrupt relief  AliveCor monitor  Personally reviewed  >> sinus at 125   Seen in ER 6/20 for CP and palps, ESR CRP ( their request) were normal as was delta hsTN    His sister is s/p myectomy and defibrillator implantation at [facility] testing was positive for myosin binding protein mutation       Records and Results Reviewed he also has an appointment to see soon  Past Medical History:  Diagnosis Date  . Hypertension   . Hypertrophic cardiomyopathy ([place])   . Sinus tachycardia ? autonomic   . SVT (supraventricular tachycardia) (HCC) s/p AV nodal modification     Past Surgical History:  Procedure Laterality Date  . FINGER SURGERY Left    ring finger  . SUBQ ICD IMPLANT N/A 09/18/2018   Procedure: SUBQ ICD IMPLANT;  Surgeon: Deboraha Sprang, MD;  Location: [place] CV LAB;  Service: Cardiovascular;  Laterality: N/A;    Current Meds  Medication Sig  . Acetylcysteine (N-ACETYL-L-CYSTEINE) 600 MG CAPS Take 600 mg by mouth.  . ALPRAZolam (XANAX) 0.25 MG tablet Take 0.25 mg by mouth 2 (two) times daily as needed for anxiety.   Marland Kitchen b complex vitamins capsule Take 1 capsule by mouth daily.  . cetirizine (ZYRTEC) 10 MG tablet Take 10 mg by mouth daily as needed for allergies.   Marland Kitchen ELDERBERRY PO Take by mouth.  . fluticasone (FLONASE) 50 MCG/ACT nasal spray Place 1 spray  into both nostrils daily as needed for allergies or rhinitis.  . metoprolol succinate (TOPROL-XL) 100 MG 24 hr tablet Take 2 tablets by mouth daily.  Marland Kitchen omeprazole (PRILOSEC) 40 MG capsule Take 40 mg by mouth every evening.  . propranolol (INDERAL) 20 MG tablet Take 1 tablet (20 mg total) by mouth every 4 (four) hours as needed (for tachy palpitations).    Allergies  Allergen Reactions  . Penicillins Anaphylaxis, Hives and Swelling    Has patient had a PCN reaction causing immediate rash, facial/tongue/throat swelling, SOB or lightheadedness with hypotension: Yes Has patient had a PCN reaction causing severe rash involving mucus membranes or skin necrosis: No Has patient had a PCN reaction that required hospitalization: No Has patient had a PCN reaction occurring within the last 10 years: No If all of the above answers are "NO", then may proceed with Cephalosporin use.       Review of Systems negative except from HPI and PMH  Physical Exam BP 120/78   Pulse 76   Ht 6' 1"  (1.854 m)   Wt 194 lb (88 kg)   SpO2 98%   BMI 25.60 kg/m  Well developed and well nourished in no acute distress HENT normal Neck supple with JVP-flat Clear Device pocket well healed; without hematoma or erythema.  There is no tethering  Regular rate and rhythm, no  murmur Abd-soft with active BS No Clubbing cyanosis   edema Skin-warm and dry A &  Oriented  Grossly normal sensory and motor function  ECG Personally reviewed  Sinus 78 14/09/39     MRI was reviewed with the patient.  He has focal septal hypertrophy.  Assessment and  Plan  Hypertrophic cardiomyopathy with gadolinium enhancement  VT nonsustained  SICD      Sinus tachycardia doubt atrial tachycardia probably autonomic    Pt has sinus tachycardia with good suppression with metoprolol   The abrupt onset and offset suggest that there may have been a component of arrhythmic tachycardia.  The AliveCor monitor does not support that.  I  have encouraged him to use it more immediately.  No interval ICD discharges.  We will plan echocardiogram-stress in the wake of COVID relief to look for outflow obstruction        Current medicines are reviewed at length with the patient today .  The patient does not  have concerns regarding medicines.

## 2019-05-11 ENCOUNTER — Encounter: Payer: Self-pay | Admitting: Internal Medicine

## 2019-05-11 ENCOUNTER — Ambulatory Visit (INDEPENDENT_AMBULATORY_CARE_PROVIDER_SITE_OTHER): Payer: BC Managed Care – PPO | Admitting: Internal Medicine

## 2019-05-11 ENCOUNTER — Telehealth: Payer: Self-pay | Admitting: Internal Medicine

## 2019-05-11 ENCOUNTER — Other Ambulatory Visit: Payer: Self-pay

## 2019-05-11 ENCOUNTER — Ambulatory Visit (INDEPENDENT_AMBULATORY_CARE_PROVIDER_SITE_OTHER): Payer: BC Managed Care – PPO | Admitting: *Deleted

## 2019-05-11 VITALS — BP 120/78 | HR 76 | Ht 73.0 in | Wt 194.0 lb

## 2019-05-11 DIAGNOSIS — I421 Obstructive hypertrophic cardiomyopathy: Secondary | ICD-10-CM | POA: Diagnosis not present

## 2019-05-11 DIAGNOSIS — I471 Supraventricular tachycardia: Secondary | ICD-10-CM

## 2019-05-11 DIAGNOSIS — Z95810 Presence of automatic (implantable) cardiac defibrillator: Secondary | ICD-10-CM

## 2019-05-11 LAB — CUP PACEART INCLINIC DEVICE CHECK
Date Time Interrogation Session: 20200721181216
Implantable Lead Implant Date: 20191129
Implantable Lead Location: 753862
Implantable Lead Model: 3501
Implantable Lead Serial Number: 163240
Implantable Pulse Generator Implant Date: 20191129
Pulse Gen Serial Number: 251605

## 2019-05-11 NOTE — Telephone Encounter (Signed)

## 2019-05-11 NOTE — Patient Instructions (Signed)

## 2019-05-12 ENCOUNTER — Telehealth: Payer: Self-pay

## 2019-05-12 NOTE — Telephone Encounter (Signed)
Spoke with patient to remind of missed remote transmission 

## 2019-05-13 LAB — CUP PACEART REMOTE DEVICE CHECK
Battery Remaining Percentage: 94 %
Date Time Interrogation Session: 20200722222200
Implantable Lead Implant Date: 20191129
Implantable Lead Location: 753862
Implantable Lead Model: 3501
Implantable Lead Serial Number: 163240
Implantable Pulse Generator Implant Date: 20191129
Pulse Gen Serial Number: 251605

## 2019-05-26 ENCOUNTER — Encounter: Payer: Self-pay | Admitting: Cardiology

## 2019-05-26 NOTE — Progress Notes (Signed)
Remote ICD transmission.   

## 2019-06-01 ENCOUNTER — Telehealth (HOSPITAL_COMMUNITY): Payer: Self-pay

## 2019-06-01 NOTE — Telephone Encounter (Signed)
Stress Echo orders cancelled per pt's request.

## 2019-06-01 NOTE — Telephone Encounter (Signed)
New message   Call the patient to set up a stress echocardiogram order by Dr. Virl Axe.   The patient advises that he will be having stress echo done in [place] at [facility], my chart messages were sent to MD by the patient.   The patient asked to cancel out the stress echo order.

## 2019-06-03 ENCOUNTER — Encounter: Payer: BLUE CROSS/BLUE SHIELD | Admitting: Genetic Counselor

## 2019-06-04 DIAGNOSIS — Z1159 Encounter for screening for other viral diseases: Secondary | ICD-10-CM | POA: Diagnosis not present

## 2019-06-16 DIAGNOSIS — I422 Other hypertrophic cardiomyopathy: Secondary | ICD-10-CM | POA: Diagnosis not present

## 2019-06-16 DIAGNOSIS — Z95810 Presence of automatic (implantable) cardiac defibrillator: Secondary | ICD-10-CM | POA: Diagnosis not present

## 2019-06-16 DIAGNOSIS — R002 Palpitations: Secondary | ICD-10-CM | POA: Diagnosis not present

## 2019-06-16 DIAGNOSIS — I472 Ventricular tachycardia: Secondary | ICD-10-CM | POA: Diagnosis not present

## 2019-06-16 DIAGNOSIS — I517 Cardiomegaly: Secondary | ICD-10-CM | POA: Diagnosis not present

## 2019-06-21 DIAGNOSIS — I422 Other hypertrophic cardiomyopathy: Secondary | ICD-10-CM | POA: Diagnosis not present

## 2019-08-10 ENCOUNTER — Ambulatory Visit (INDEPENDENT_AMBULATORY_CARE_PROVIDER_SITE_OTHER): Payer: BC Managed Care – PPO | Admitting: *Deleted

## 2019-08-10 DIAGNOSIS — I428 Other cardiomyopathies: Secondary | ICD-10-CM

## 2019-08-10 DIAGNOSIS — I429 Cardiomyopathy, unspecified: Secondary | ICD-10-CM

## 2019-08-11 LAB — CUP PACEART REMOTE DEVICE CHECK
Battery Remaining Percentage: 91 %
Date Time Interrogation Session: 20201021032900
Implantable Lead Implant Date: 20191129
Implantable Lead Location: 753862
Implantable Lead Model: 3501
Implantable Lead Serial Number: 163240
Implantable Pulse Generator Implant Date: 20191129
Pulse Gen Serial Number: 251605

## 2019-08-26 NOTE — Progress Notes (Signed)
Remote ICD transmission.   

## 2019-09-27 NOTE — Telephone Encounter (Signed)
Message sent to [facility], [employer] rep, to confirm that patient's device and lead are part of advisory. Will discuss with Dr. Caryl Comes after receiving confirmation.

## 2019-09-29 DIAGNOSIS — I429 Cardiomyopathy, unspecified: Secondary | ICD-10-CM | POA: Diagnosis not present

## 2019-09-29 DIAGNOSIS — L718 Other rosacea: Secondary | ICD-10-CM | POA: Diagnosis not present

## 2019-09-29 DIAGNOSIS — I422 Other hypertrophic cardiomyopathy: Secondary | ICD-10-CM | POA: Diagnosis not present

## 2019-09-29 DIAGNOSIS — D229 Melanocytic nevi, unspecified: Secondary | ICD-10-CM | POA: Diagnosis not present

## 2019-10-01 DIAGNOSIS — R000 Tachycardia, unspecified: Secondary | ICD-10-CM | POA: Diagnosis not present

## 2019-10-19 ENCOUNTER — Ambulatory Visit: Payer: BC Managed Care – PPO | Attending: Internal Medicine

## 2019-10-19 DIAGNOSIS — U071 COVID-19: Secondary | ICD-10-CM

## 2019-10-19 DIAGNOSIS — R238 Other skin changes: Secondary | ICD-10-CM | POA: Diagnosis not present

## 2019-10-20 LAB — NOVEL CORONAVIRUS, NAA: SARS-CoV-2, NAA: NOT DETECTED

## 2019-11-09 ENCOUNTER — Ambulatory Visit (INDEPENDENT_AMBULATORY_CARE_PROVIDER_SITE_OTHER): Payer: BC Managed Care – PPO | Admitting: *Deleted

## 2019-11-09 DIAGNOSIS — I428 Other cardiomyopathies: Secondary | ICD-10-CM | POA: Diagnosis not present

## 2019-11-16 LAB — CUP PACEART REMOTE DEVICE CHECK
Battery Remaining Percentage: 87 %
Date Time Interrogation Session: 20210120072400
Implantable Lead Implant Date: 20191129
Implantable Lead Location: 753862
Implantable Lead Model: 3501
Implantable Lead Serial Number: 163240
Implantable Pulse Generator Implant Date: 20191129
Pulse Gen Serial Number: 251605

## 2019-11-18 DIAGNOSIS — I472 Ventricular tachycardia: Secondary | ICD-10-CM | POA: Diagnosis not present

## 2019-11-18 DIAGNOSIS — I421 Obstructive hypertrophic cardiomyopathy: Secondary | ICD-10-CM | POA: Diagnosis not present

## 2019-11-18 DIAGNOSIS — I2510 Atherosclerotic heart disease of native coronary artery without angina pectoris: Secondary | ICD-10-CM | POA: Diagnosis not present

## 2019-11-18 DIAGNOSIS — I10 Essential (primary) hypertension: Secondary | ICD-10-CM | POA: Diagnosis not present

## 2019-11-25 ENCOUNTER — Telehealth: Payer: Self-pay | Admitting: Internal Medicine

## 2019-11-25 NOTE — Telephone Encounter (Signed)
Spoke with pt who states he woke at 5am this morning "feeling strange."  Pt states he felt as if he had ice water running through his veins but was not cold.  Pt took his blood pressure with reading of 149/96.  Pt states he took an Inderal and a Xanax.  Last B/P reading about 1245pm was 120/79.  Pt denies any new SOB or weakness.  Pt reports taking his medications as prescribed.  Pt reports he saw his PCP, Dr Sharyn Lull last week and diastolic reading was 110.  Pt states he was given Rx for Losartan 25mg  but has not filled it d/t being unsure if PCP has a good understanding of his heart condition.  Pt advised if he has a spike in B/P with any numbness, weakness, SOB or CP to go to ED for further evaluation.  Will forward to Dr for further review and recommendation.  Pt verbalizes understanding and agrees with current plan.

## 2019-11-25 NOTE — Telephone Encounter (Signed)
Spoke with pt and advised per Dr Graciela Husbands he agrees with PCP that Losartan is a good choice.  Pt states he fill medication.  Advised pt to keep B/P log and follow up with PCP as recommended.  Pt verbalizes understanding and agrees with current plan.

## 2019-11-25 NOTE — Telephone Encounter (Signed)
Losartan is a good choice

## 2019-11-25 NOTE — Telephone Encounter (Signed)
Pt c/o BP issue: STAT if pt c/o blurred vision, one-sided weakness or slurred speech  1. What are your last 5 BP readings?  11/25/19 149/96 129/92 142/92   2. Are you having any other symptoms (ex. Dizziness, headache, blurred vision, passed out)? SOB  3. What is your BP issue? Patient is calling stating he woke up this morning having problems with his BP. He states it feels as if ice water is running through his veins. He also says he is experiencing some SOB. Please advise.

## 2019-11-28 NOTE — Progress Notes (Deleted)
Cardiology Office Note Date:  11/28/2019  Patient ID:  Jake Huff, DOB 1975-12-30, MRN [id number] PCP:  Charolette Forward, MD  Cardiologist:  ***  ***refresh   Chief Complaint: *** 6 mo visit  History of Present Illness: Jake Huff is a 44 y.o. male with history of HTN, HCM w/focal septal hypertrophy has S-ICD  He comes in today to be seen for Dr. Caryl Comes.  Last seen by him in July 2020.  He discussed hx of palpitations, Zio patch historically noted ST and NSVT. Metoprolol seemed to improve his palpitations, there was a component of heat intolerance, as using PRN inderal. More recently palpitations identified by using AliveCor was ST 125bpm. June 2020 ER visit with CP, palp, labs unremarkable.  The patient urged to use his AliveCor more promptly to fully evaluate what he was feeling, and planned for updated echo once COVID restrictions allowed to evaluate/monitor for outflow obstruction.  *** symptoms, palps, syncope *** meds *** labs, lipids... 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 Hypertension   . Hypertrophic cardiomyopathy ([place])   . Sinus tachycardia ? autonomic   . SVT (supraventricular tachycardia) (HCC) s/p AV nodal modification     Past Surgical History:  Procedure Laterality Date  . FINGER SURGERY Left    ring finger  . SUBQ ICD IMPLANT N/A 09/18/2018   Procedure: SUBQ ICD IMPLANT;  Surgeon: Deboraha Sprang, MD;  Location: [place] CV LAB;  Service: Cardiovascular;  Laterality: N/A;    Current Outpatient Medications  Medication Sig Dispense Refill  . Acetylcysteine (N-ACETYL-L-CYSTEINE) 600 MG CAPS Take 600 mg by mouth.    . ALPRAZolam (XANAX) 0.25 MG tablet Take 0.25 mg by mouth 2 (two) times  daily as needed for anxiety.     Marland Kitchen b complex vitamins capsule Take 1 capsule by mouth daily.    . cetirizine (ZYRTEC) 10 MG tablet Take 10 mg by mouth daily as needed for allergies.     Marland Kitchen ELDERBERRY PO Take by mouth.    . fluticasone (FLONASE) 50 MCG/ACT nasal spray Place 1 spray into both nostrils daily as needed for allergies or rhinitis.    . metoprolol succinate (TOPROL-XL) 100 MG 24 hr tablet Take 2 tablets by mouth daily.    Marland Kitchen omeprazole (PRILOSEC) 40 MG capsule Take 40 mg by mouth every evening.    . propranolol (INDERAL) 20 MG tablet Take 1 tablet (20 mg total) by mouth every 4 (four) hours as needed (for tachy palpitations). 90 tablet 3   No current facility-administered medications for this visit.    Allergies:   Penicillins   Social History:  The patient  reports that he quit smoking about 10 years ago. He has never used smokeless tobacco. He reports current alcohol use. He reports that he does not use drugs.   Family History:  Noted above  ROS:  Please see the history of present illness.    All other systems are reviewed and otherwise negative.   PHYSICAL EXAM: *** VS:  There were no vitals taken for this visit. BMI: There is no height or weight on file to calculate BMI. Well nourished, well developed, in no acute distress  HEENT: normocephalic, atraumatic  Neck: no  JVD, carotid bruits or masses Cardiac:  *** RRR; no significant murmurs, no rubs, or gallops Lungs:  *** CTA b/l, no wheezing, rhonchi or rales  Abd: soft, nontender MS: no deformity or *** atrophy Ext: *** no edema  Skin: warm and dry, no rash Neuro:  No gross deficits appreciated Psych: euthymic mood, full affect  *** ICD site is stable, no tethering or discomfort   EKG:  Not done today    08/10/2018: c.MRI IMPRESSION: 1.Abnormal basal septal thickening 14 mm with delayed inversion recovery sequences showing diffuse uptake worse in the septum and inferior wall consistent with infiltrative  cardiomyopathy such as hypertrophic cardiomyopathy Suggestion of abnormal ECV in the septum as well T1 map not diagnostic  2.Normal LV EF 62%  3.No SAM or LVOT obstruction noted Some flow acceleration around sigmoid septum Some apical displacement of papillary muscles that are hypertrophied  4.No significant MR notes  5.  No pericardial effusion   07/17/2018: TTE Study Conclusions - Left ventricle: The cavity size was normal. There was mild focal  basal hypertrophy of the septum. Mild dynamic LVOT obstruction  with valsalva. Mildly abnormal GLS at -18% with inferoseptal  strain abnormality. Systolic function was normal. The estimated  ejection fraction was in the range of 55% to 60%. Wall motion was  normal; there were no regional wall motion abnormalities. Doppler  parameters are consistent with abnormal left ventricular  relaxation (grade 1 diastolic dysfunction). The E/e&' ratio is  between 8-15, suggesting indeterminate LV filling pressure.  - Mitral valve: Mildly thickened leaflets . There was trivial  regurgitation.  - Left atrium: The atrium was normal in size.  - Right atrium: The atrium was mildly dilated.  - Tricuspid valve: There was trivial regurgitation.  - Pulmonary arteries: PA peak pressure: 19 mm Hg (S).  - Inferior vena cava: The vessel was normal in size. The  respirophasic diameter changes were in the normal range (>= 50%),  consistent with normal central venous pressure.   Impressions:  - Compared to a prior study in 2011, the is mild focal septal  hypertrophy, but no significant dynamic LVOT obstruction with  peak gradient after valsalva of 16 mmHg.      Recent Labs: 04/14/2019: B Natriuretic Peptide 76.1; BUN 9; Creatinine, Ser 0.94; Hemoglobin 15.7; Platelets 297; Potassium 4.1; Sodium 140  No results found for requested labs within last 8760 hours.   CrCl cannot be calculated (Patient's most recent lab result is older  than the maximum 21 days allowed.).   Wt Readings from Last 3 Encounters:  05/11/19 194 lb (88 kg)  04/14/19 189 lb 9.5 oz (86 kg)  02/02/19 190 lb (86.2 kg)     Other studies reviewed: Additional studies/records reviewed today include: summarized above  ASSESSMENT AND PLAN:  1. S-ICD     Device electrode is on advisory     ***  2. HCM     No outlet obstruction to date     *** update echo  3. HTN     ***  Disposition: F/u with ***  Current medicines are reviewed at length with the patient today.  The patient did not have any concerns regarding medicines.***  Signed, Francis Dowse, PA-C 11/28/2019 4:41 PM     [facility] [address] [place] [address] [phone] (office)  [phone] (fax)

## 2019-11-29 ENCOUNTER — Encounter: Payer: BC Managed Care – PPO | Admitting: Physician Assistant

## 2019-11-29 NOTE — Progress Notes (Addendum)
Cardiology Office Note Date:  12/01/2019  Patient ID:  Jake Huff, DOB 04/23/1976, MRN [id number] PCP:  Rinaldo Cloud, MD  Electrophysiologist: Dr. Graciela Husbands  He also sees Dr. Tamala B[facility] [phone]     Chief Complaint: 6 mo visit  History of Present Illness: Jake Huff is a 44 y.o. male with history of adenosine sensitive SVT (s/p AVNRT ablation by dr. 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 Graciela Husbands.  Last seen by him in July 2020.  He discussed hx of palpitations, Zio patch historically noted ST and NSVT. Metoprolol seemed to improve his palpitations, there was a component of heat intolerance, as using PRN inderal. More recently palpitations identified by using AliveCor was ST 125bpm. June 2020 ER visit with CP, palp, labs unremarkable. The patient urged to use his AliveCor more promptly to fully evaluate what he was feeling, and planned for updated echo once COVID restrictions allowed to evaluate/monitor for outflow obstruction.   He comes today with some intermittent SOB/DOE, this is not a new symptoms, and reports historically has been much worse.  He denies any rest SOB, no symptoms or PND, perhaps some SOB in bed on occasion.  His SOB is not persistent or daily, nightly, sees random.  He mentions there are days he can "go forever" and some days winded with minimal exertion. He states he had a stress echo by Dr. Annie Main last summer to evaluate this symptoms and was told he had dynamic obstruction and no changes were planned, not felt to require surgery.  He has palpitations.  These are generally worse in hot months, heated environments, winters they settle for him.  These are also random, though not associated with his SOB.  His AliveCor no longer works, and has been tld it is SVT. He wore a monitor as  well last summer with Dr. Annie Main and recalls being told he had PAcs, PVCs, and NSVT There was he was started on diltiazem though this made him feel terrible and was stopped. Since his last dose adjustment of his metoprolol his palpitations are better. The PRN propanolol very much simmers his palpitations down when he gets them, says it works well.  No near syncope or syncope, no device shocks ever, no CP.  He has felt pretty good of late.  He received a letter from [facility] scientific notifying him of hid device advisory  He has noted HTN recently, saw Dr. Sharyn Lull a week or so ago and started on losartan.  He quit drinking to try and help his BP, noting in the evenings especially his BP and HR tended to be higher then day time   Device information BSCi S-ICD implanted 09/18/2018 primary prevention No history of therapies Device is involved in lead advisory Pt was demonstrated today alert tone Discussed device advisory     Past Medical History:  Diagnosis Date  . Hypertension   . Hypertrophic cardiomyopathy (HCC)   . Sinus tachycardia ? autonomic   . SVT (supraventricular tachycardia) (HCC) s/p AV nodal modification     Past Surgical History:  Procedure Laterality Date  . FINGER SURGERY Left    ring finger  . SUBQ ICD IMPLANT N/A 09/18/2018   Procedure: SUBQ ICD IMPLANT;  Surgeon: Duke Salvia, MD;  Location: [facility] INVASIVE CV LAB;  Service: Cardiovascular;  Laterality: N/A;  Current Outpatient Medications  Medication Sig Dispense Refill  . Acetylcysteine (N-ACETYL-L-CYSTEINE) 600 MG CAPS Take 600 mg by mouth.    . ALPRAZolam (XANAX) 0.25 MG tablet Take 0.25 mg by mouth 2 (two) times daily as needed for anxiety.     Marland Kitchen b complex vitamins capsule Take 1 capsule by mouth daily.    . cetirizine (ZYRTEC) 10 MG tablet Take 10 mg by mouth daily as needed for allergies.     Marland Kitchen ELDERBERRY PO Take by mouth.    . fluticasone (FLONASE) 50 MCG/ACT nasal spray Place 1 spray into both  nostrils daily as needed for allergies or rhinitis.    Marland Kitchen losartan (COZAAR) 25 MG tablet Take 25 mg by mouth daily.    . metoprolol succinate (TOPROL-XL) 100 MG 24 hr tablet Take 2 tablets by mouth daily.    Marland Kitchen omeprazole (PRILOSEC) 40 MG capsule Take 40 mg by mouth every evening.    . propranolol (INDERAL) 20 MG tablet Take 1 tablet (20 mg total) by mouth every 4 (four) hours as needed (for tachy palpitations). 90 tablet 3   No current facility-administered medications for this visit.    Allergies:   Penicillins   Social History:  The patient  reports that he quit smoking about 10 years ago. He has never used smokeless tobacco. He reports current alcohol use. He reports that he does not use drugs.   Family History:  Noted above  ROS:  Please see the history of present illness.    All other systems are reviewed and otherwise negative.   PHYSICAL EXAM:  VS:  BP 126/84   Pulse 88   Ht 6\' 1"  (1.854 m)   Wt 206 lb (93.4 kg)   SpO2 98%   BMI 27.18 kg/m  BMI: Body mass index is 27.18 kg/m. Well nourished, well developed, in no acute distress  HEENT: normocephalic, atraumatic  Neck: no JVD, carotid bruits or masses Cardiac:  RRR; no significant murmurs, no rubs, or gallops Lungs:  CTA b/l, no wheezing, rhonchi or rales  Abd: soft, nontender MS: no deformity or atrophy Ext:  no edema  Skin: warm and dry, no rash Neuro:  No gross deficits appreciated Psych: euthymic mood, full affect  ICD site is stable, no tethering or discomfort   EKG:  Not done today  ICD interrogation done today with industry support and reviewed by myself Battery is at 87% Electrode impedance is good EGMs reviewed in all lead configurations, no noise was noted    08/10/2018: c.MRI IMPRESSION: 1.Abnormal basal septal thickening 14 mm with delayed inversion recovery sequences showing diffuse uptake worse in the septum and inferior wall consistent with infiltrative cardiomyopathy such as hypertrophic  cardiomyopathy Suggestion of abnormal ECV in the septum as well T1 map not diagnostic  2.Normal LV EF 62%  3.No SAM or LVOT obstruction noted Some flow acceleration around sigmoid septum Some apical displacement of papillary muscles that are hypertrophied  4.No significant MR notes  5.  No pericardial effusion   07/17/2018: TTE Study Conclusions - Left ventricle: The cavity size was normal. There was mild focal  basal hypertrophy of the septum. Mild dynamic LVOT obstruction  with valsalva. Mildly abnormal GLS at -18% with inferoseptal  strain abnormality. Systolic function was normal. The estimated  ejection fraction was in the range of 55% to 60%. Wall motion was  normal; there were no regional wall motion abnormalities. Doppler  parameters are consistent with abnormal left ventricular  relaxation (grade 1 diastolic dysfunction).  The E/e&' ratio is  between 8-15, suggesting indeterminate LV filling pressure.  - Mitral valve: Mildly thickened leaflets . There was trivial  regurgitation.  - Left atrium: The atrium was normal in size.  - Right atrium: The atrium was mildly dilated.  - Tricuspid valve: There was trivial regurgitation.  - Pulmonary arteries: PA peak pressure: 19 mm Hg (S).  - Inferior vena cava: The vessel was normal in size. The  respirophasic diameter changes were in the normal range (>= 50%),  consistent with normal central venous pressure.   Impressions:  - Compared to a prior study in 2011, the is mild focal septal  hypertrophy, but no significant dynamic LVOT obstruction with  peak gradient after valsalva of 16 mmHg.      Recent Labs: 04/14/2019: B Natriuretic Peptide 76.1; BUN 9; Creatinine, Ser 0.94; Hemoglobin 15.7; Platelets 297; Potassium 4.1; Sodium 140  No results found for requested labs within last 8760 hours.   CrCl cannot be calculated (Patient's most recent lab result is older than the maximum 21 days allowed.).    Wt Readings from Last 3 Encounters:  12/01/19 206 lb (93.4 kg)  05/11/19 194 lb (88 kg)  04/14/19 189 lb 9.5 oz (86 kg)     Other studies reviewed: Additional studies/records reviewed today include: summarized above  ASSESSMENT AND PLAN:  1. S-ICD     Device electrode is on advisory, discussed with the patient today     he is enrolled in [employer] and participating in remote checks     The patient was demonstrated today the alert tone   Industry rep today looked up his generator, and his is not involved in early depletion advisory   2. HCM          Discussed updating his echo though he had stress echo done last summer at [facility] clinic, reportedly with dynamic obstructions Has some intermittent SOB/DOE He does not appear volume OL No syncope   3. HTN     Recently started on losartan by Dr. Sharyn Lull     He has observed especially eveningh high readings, diastolic particularly     Advised to take the losartan mid-day and continue to follow his BP  4. Palpitations     Not new, and of late improved     Known ST as well as NSVT     No near syncope or syncope     Hot weather/environments are a trigger for him and advised to avoid hot showers, baths, hot tubs etc, and in hot months, avoid mid day outside activities, so on     I have discussed case with Dr. Graciela Husbands   Disposition: We will request his last note, stress echo and monitor from Dr. Annie Main at the [facility], will have him see Dr. Graciela Husbands in a month, sooner if needed.    Current medicines are reviewed at length with the patient today.  The patient did not have any concerns regarding medicines.  Norma Fredrickson, PA-C 12/01/2019 12:50 PM     [facility] [address] [place] [address] [phone] (office)  [phone] (fax)

## 2019-12-01 ENCOUNTER — Ambulatory Visit: Payer: BC Managed Care – PPO | Admitting: Physician Assistant

## 2019-12-01 ENCOUNTER — Other Ambulatory Visit: Payer: Self-pay

## 2019-12-01 VITALS — BP 126/84 | HR 88 | Ht 73.0 in | Wt 206.0 lb

## 2019-12-01 DIAGNOSIS — I428 Other cardiomyopathies: Secondary | ICD-10-CM

## 2019-12-01 DIAGNOSIS — R002 Palpitations: Secondary | ICD-10-CM

## 2019-12-01 DIAGNOSIS — I10 Essential (primary) hypertension: Secondary | ICD-10-CM | POA: Diagnosis not present

## 2019-12-01 DIAGNOSIS — I422 Other hypertrophic cardiomyopathy: Secondary | ICD-10-CM | POA: Diagnosis not present

## 2019-12-01 DIAGNOSIS — I429 Cardiomyopathy, unspecified: Secondary | ICD-10-CM

## 2019-12-01 DIAGNOSIS — Z95810 Presence of automatic (implantable) cardiac defibrillator: Secondary | ICD-10-CM

## 2019-12-01 DIAGNOSIS — R0602 Shortness of breath: Secondary | ICD-10-CM

## 2019-12-01 NOTE — Patient Instructions (Addendum)
Medication Instructions:   START TAKING LOSARTAN 25 MG ONCE A DAY MIDDAY   *If you need a refill on your cardiac medications before your next appointment, please call your pharmacy*  Lab Work:  NONE ORDERED  TODAY   If you have labs (blood work) drawn today and your tests are completely normal, you will receive your results only by: Marland Kitchen MyChart Message (if you have MyChart) OR . A paper copy in the mail If you have any lab test that is abnormal or we need to change your treatment, we will call you to review the results.  Testing/Procedures: Your physician has requested that you have an echocardiogram. Echocardiography is a painless test that uses sound waves to create images of your heart. It provides your doctor with information about the size and shape of your heart and how well your heart's chambers and valves are working. This procedure takes approximately one hour. There are no restrictions for this procedure.   Follow-Up: At [facility], you and your health needs are our priority.  As part of our continuing mission to provide you with exceptional heart care, we have created designated Provider Care Teams.  These Care Teams include your primary Cardiologist (physician) and Advanced Practice Providers (APPs -  Physician Assistants and Nurse Practitioners) who all work together to provide you with the care you need, when you need it.  Your next appointment:   1 mointh   The format for your next appointment:   In Person  Provider:  Dr Graciela Husbands  ONLY!    Other Instructions

## 2019-12-29 DIAGNOSIS — Z95810 Presence of automatic (implantable) cardiac defibrillator: Secondary | ICD-10-CM | POA: Diagnosis not present

## 2019-12-29 DIAGNOSIS — R002 Palpitations: Secondary | ICD-10-CM | POA: Diagnosis not present

## 2019-12-29 DIAGNOSIS — I422 Other hypertrophic cardiomyopathy: Secondary | ICD-10-CM | POA: Diagnosis not present

## 2019-12-29 DIAGNOSIS — I472 Ventricular tachycardia: Secondary | ICD-10-CM | POA: Diagnosis not present

## 2020-01-02 ENCOUNTER — Telehealth: Payer: Self-pay | Admitting: Cardiology

## 2020-01-02 NOTE — Telephone Encounter (Signed)
    Pt called answering service with c/o mottling in his lower extremities. He reports no other symptoms such as chest pain, pain in his legs, no change in sensation. Has mild dizziness however this is stable. Denies SOB, palpitations or syncope. He states he was recently prescribed Losartan for his BP however has not yet started this medication. Low suspicion for cardiac etiology at this point. Would recommend that he be seen by his PCP or APP with Dr. Graciela Husbands if possible. Difficult to determine what is going on given no ability to physically see his lower extremities. ED precautions reviewed. Patient understands and agrees.    Georgie Chard NP-C [facility] Pager: [phone]

## 2020-01-10 DIAGNOSIS — R002 Palpitations: Secondary | ICD-10-CM | POA: Insufficient documentation

## 2020-01-10 DIAGNOSIS — I422 Other hypertrophic cardiomyopathy: Secondary | ICD-10-CM | POA: Insufficient documentation

## 2020-01-10 DIAGNOSIS — I421 Obstructive hypertrophic cardiomyopathy: Secondary | ICD-10-CM | POA: Insufficient documentation

## 2020-01-12 ENCOUNTER — Ambulatory Visit: Payer: BC Managed Care – PPO | Admitting: Internal Medicine

## 2020-01-12 ENCOUNTER — Encounter: Payer: Self-pay | Admitting: Internal Medicine

## 2020-01-12 ENCOUNTER — Other Ambulatory Visit: Payer: Self-pay

## 2020-01-12 DIAGNOSIS — R002 Palpitations: Secondary | ICD-10-CM | POA: Diagnosis not present

## 2020-01-12 DIAGNOSIS — I422 Other hypertrophic cardiomyopathy: Secondary | ICD-10-CM | POA: Diagnosis not present

## 2020-01-12 NOTE — Patient Instructions (Signed)
Medication Instructions:  Your physician recommends that you continue on your current medications as directed. Please refer to the Current Medication list given to you today.  Labwork: None ordered.  Testing/Procedures: None ordered.  Follow-Up: Your physician wants you to follow-up in: 6 months with Dr Klein. You will receive a reminder letter in the mail two months in advance. If you don't receive a letter, please call our office to schedule the follow-up appointment.  Remote monitoring is used to monitor your Pacemaker of ICD from home. This monitoring reduces the number of office visits required to check your device to one time per year. It allows us to keep an eye on the functioning of your device to ensure it is working properly.Any Other Special Instructions Will Be Listed Below (If Applicable).  If you need a refill on your cardiac medications before your next appointment, please call your pharmacy.   

## 2020-01-12 NOTE — Progress Notes (Signed)
Patient Care Team: Rinaldo Cloud, MD as PCP - General (Cardiology)   HPI  Jake Huff is a 44 y.o. male Seen in follow-up for hypertrophic cardia myopathy recently diagnosed and recurrent tachypalpitations. He is s/p SICD implant   He is seen today in follow-up and also because he has a class I recall on the fracture risk.     Recurrent tachypalpitations >> ZIO Patch-->>> demonstrated sinus tachycardia. also  recurrent nonsustained ventricular tachycardia.  Tachypalpitations still with little effort but now much improved as is his heat tolerance on the recetnly uptitrated metoprolol 150>>200   Also uses prn inderal  Most recently abrupt onset tachypalps while sitting and then abrupt relief  AliveCor monitor  Personally reviewed  >> sinus at 125  Recently started on losartan.  Not tolerated.  Ended up with livido.  Changed to verapamil by the HCM group at [facility].  He feels much much better.  Less shortness of breath.  Less palpitations.      His sister is s/p myectomy and defibrillator implantation at [facility] testing was positive for myosin binding protein mutation       Records and Results Reviewed he also has an appointment to see soon  Past Medical History:  Diagnosis Date  . Hypertension   . Hypertrophic cardiomyopathy (HCC)   . Sinus tachycardia ? autonomic   . SVT (supraventricular tachycardia) (HCC) s/p AV nodal modification     Past Surgical History:  Procedure Laterality Date  . FINGER SURGERY Left    ring finger  . SUBQ ICD IMPLANT N/A 09/18/2018   Procedure: SUBQ ICD IMPLANT;  Surgeon: Duke Salvia, MD;  Location: [facility] INVASIVE CV LAB;  Service: Cardiovascular;  Laterality: N/A;    Current Meds  Medication Sig  . Acetylcysteine (N-ACETYL-L-CYSTEINE) 600 MG CAPS Take 600 mg by mouth.  . ALPRAZolam (XANAX) 0.25 MG tablet Take 0.25 mg by mouth 2 (two) times daily as needed for anxiety.   Marland Kitchen b complex vitamins capsule Take 1  capsule by mouth daily.  . cetirizine (ZYRTEC) 10 MG tablet Take 10 mg by mouth daily as needed for allergies.   Marland Kitchen ELDERBERRY PO Take by mouth.  . fluticasone (FLONASE) 50 MCG/ACT nasal spray Place 1 spray into both nostrils daily as needed for allergies or rhinitis.  . metoprolol succinate (TOPROL-XL) 100 MG 24 hr tablet Take 2 tablets by mouth daily.  Marland Kitchen omeprazole (PRILOSEC) 40 MG capsule Take 40 mg by mouth every evening.  . propranolol (INDERAL) 20 MG tablet Take 1 tablet (20 mg total) by mouth every 4 (four) hours as needed (for tachy palpitations).    Allergies  Allergen Reactions  . Penicillins Anaphylaxis, Hives and Swelling    Has patient had a PCN reaction causing immediate rash, facial/tongue/throat swelling, SOB or lightheadedness with hypotension: Yes Has patient had a PCN reaction causing severe rash involving mucus membranes or skin necrosis: No Has patient had a PCN reaction that required hospitalization: No Has patient had a PCN reaction occurring within the last 10 years: No If all of the above answers are "NO", then may proceed with Cephalosporin use.       Review of Systems negative except from HPI and PMH  Physical Exam BP 112/80   Pulse 68   Ht 6' (1.829 m)   Wt 207 lb (93.9 kg)   SpO2 97%   BMI 28.07 kg/m  Well developed and well nourished in no acute distress HENT normal Neck supple  with JVP-flat Clear Device pocket well healed; without hematoma or erythema.  There is no tethering  Regular rate and rhythm, no   murmur Abd-soft with active BS No Clubbing cyanosis  edema Skin-warm and dry A & Oriented  Grossly normal sensory and motor function  ECG sinus at 68   intervals 15/09/41 Inferior Q waves    MRI was reviewed with the patient.  He has focal septal hypertrophy.  Assessment and  Plan  Hypertrophic cardiomyopathy with gadolinium enhancement  VT nonsustained  SICD      Class I recall  Sinus tachycardia doubt atrial tachycardia  probably autonomic  The patient's SICD lead is subject to a grade one recall because of risk of fracture of the lead just distal to the proximal electrode o  resulting in potential for inappropriate shocks and an inability to deliver an appropriate shock.  We have discussed the risk of this event and given the patients status as primary  we have elected to continue to monitor and not   replace the lead at this time. We have discussed the benefits and risks of programming sensing to include or exclude the distal electrode.  In its exclusion, there would be no warnings from inappropriate electrograms but there is no risk of inappropriate shocks.  Inclusion results in the opposite.   We have reviewed the importance of regular interrogation of the device so as to have some opportunity for alerts, have demonstrated the beep tones and stressed the importance of [place] checking BEEP tones following MRI.   He has elected to keep it in a secondary configuration  Much less dyspnea on the verapamil.          Current medicines are reviewed at length with the patient today .  The patient does not  have concerns regarding medicines.

## 2020-02-08 ENCOUNTER — Ambulatory Visit (INDEPENDENT_AMBULATORY_CARE_PROVIDER_SITE_OTHER): Payer: BC Managed Care – PPO | Admitting: *Deleted

## 2020-02-08 DIAGNOSIS — I428 Other cardiomyopathies: Secondary | ICD-10-CM

## 2020-02-08 DIAGNOSIS — I429 Cardiomyopathy, unspecified: Secondary | ICD-10-CM

## 2020-02-10 LAB — CUP PACEART REMOTE DEVICE CHECK
Battery Remaining Percentage: 84 %
Date Time Interrogation Session: 20210421221300
Implantable Lead Implant Date: 20191129
Implantable Lead Location: 753862
Implantable Lead Model: 3501
Implantable Lead Serial Number: 163240
Implantable Pulse Generator Implant Date: 20191129
Pulse Gen Serial Number: 251605

## 2020-02-10 NOTE — Progress Notes (Signed)
ICD Remote  

## 2020-02-25 ENCOUNTER — Other Ambulatory Visit: Payer: Self-pay | Admitting: Internal Medicine

## 2020-02-29 ENCOUNTER — Other Ambulatory Visit: Payer: Self-pay | Admitting: Internal Medicine

## 2020-03-23 ENCOUNTER — Telehealth: Payer: Self-pay

## 2020-03-23 NOTE — Telephone Encounter (Signed)
The pt states he was not sure if his transmission came thru because it turned yellow. I told him it came thru. If it turns yellow while trying to transmit it is updating and will come thru when it finish.

## 2020-03-31 ENCOUNTER — Other Ambulatory Visit: Payer: Self-pay | Admitting: Physician Assistant

## 2020-05-04 MED ORDER — METOPROLOL SUCCINATE ER 100 MG PO TB24: 100.0000 mg | ORAL_TABLET | Freq: Two times a day (BID) | ORAL | 3 refills | Status: DC

## 2020-05-09 ENCOUNTER — Ambulatory Visit (INDEPENDENT_AMBULATORY_CARE_PROVIDER_SITE_OTHER): Payer: BC Managed Care – PPO | Admitting: *Deleted

## 2020-05-09 DIAGNOSIS — I428 Other cardiomyopathies: Secondary | ICD-10-CM

## 2020-05-09 DIAGNOSIS — I429 Cardiomyopathy, unspecified: Secondary | ICD-10-CM

## 2020-05-11 LAB — CUP PACEART REMOTE DEVICE CHECK
Battery Remaining Percentage: 81 %
Date Time Interrogation Session: 20210721170200
Implantable Lead Implant Date: 20191129
Implantable Lead Location: 753862
Implantable Lead Model: 3501
Implantable Lead Serial Number: 163240
Implantable Pulse Generator Implant Date: 20191129
Pulse Gen Serial Number: 251605

## 2020-05-11 NOTE — Progress Notes (Signed)
Remote ICD transmission.   

## 2020-05-22 LAB — CUP PACEART INCLINIC DEVICE CHECK
Date Time Interrogation Session: 20210324084502
Implantable Lead Implant Date: 20191129
Implantable Lead Location: 753862
Implantable Lead Model: 3501
Implantable Lead Serial Number: 163240
Implantable Pulse Generator Implant Date: 20191129
Pulse Gen Serial Number: 251605

## 2020-06-14 DIAGNOSIS — I422 Other hypertrophic cardiomyopathy: Secondary | ICD-10-CM | POA: Diagnosis not present

## 2020-06-14 DIAGNOSIS — I10 Essential (primary) hypertension: Secondary | ICD-10-CM | POA: Diagnosis not present

## 2020-06-14 DIAGNOSIS — I517 Cardiomegaly: Secondary | ICD-10-CM | POA: Diagnosis not present

## 2020-06-14 DIAGNOSIS — I472 Ventricular tachycardia: Secondary | ICD-10-CM | POA: Diagnosis not present

## 2020-06-14 DIAGNOSIS — R079 Chest pain, unspecified: Secondary | ICD-10-CM | POA: Diagnosis not present

## 2020-06-21 DIAGNOSIS — R002 Palpitations: Secondary | ICD-10-CM | POA: Diagnosis not present

## 2020-06-21 DIAGNOSIS — I472 Ventricular tachycardia: Secondary | ICD-10-CM | POA: Diagnosis not present

## 2020-07-05 ENCOUNTER — Ambulatory Visit (INDEPENDENT_AMBULATORY_CARE_PROVIDER_SITE_OTHER): Payer: BC Managed Care – PPO

## 2020-07-05 ENCOUNTER — Other Ambulatory Visit: Payer: Self-pay | Admitting: Podiatry

## 2020-07-05 ENCOUNTER — Ambulatory Visit: Payer: BC Managed Care – PPO | Admitting: Podiatry

## 2020-07-05 ENCOUNTER — Encounter: Payer: Self-pay | Admitting: Podiatry

## 2020-07-05 ENCOUNTER — Other Ambulatory Visit: Payer: Self-pay

## 2020-07-05 DIAGNOSIS — I472 Ventricular tachycardia: Secondary | ICD-10-CM | POA: Diagnosis not present

## 2020-07-05 DIAGNOSIS — M1000 Idiopathic gout, unspecified site: Secondary | ICD-10-CM | POA: Diagnosis not present

## 2020-07-05 DIAGNOSIS — M7661 Achilles tendinitis, right leg: Secondary | ICD-10-CM | POA: Diagnosis not present

## 2020-07-05 DIAGNOSIS — K219 Gastro-esophageal reflux disease without esophagitis: Secondary | ICD-10-CM | POA: Insufficient documentation

## 2020-07-05 DIAGNOSIS — M205X9 Other deformities of toe(s) (acquired), unspecified foot: Secondary | ICD-10-CM

## 2020-07-05 DIAGNOSIS — R002 Palpitations: Secondary | ICD-10-CM | POA: Diagnosis not present

## 2020-07-05 NOTE — Progress Notes (Signed)
Subjective:   Patient ID: Jake Huff, male   DOB: 44 y.o.   MRN: [id number]   HPI Patient presents stating he has a lot of pain in the back of his right Achilles and it seems like it is moved and it sore now where it inserts into the bone.  He has iced it and is taken ibuprofen and shoe gear modifications.  Also states he has had history of occasional flareups of big toe joint pain left over right and does have some form of systemic arthritis   Review of Systems  All other systems reviewed and are negative.       Objective:  Physical Exam Vitals and nursing note reviewed.  Constitutional:      Appearance: He is well-developed.  Pulmonary:     Effort: Pulmonary effort is normal.  Musculoskeletal:        General: Normal range of motion.  Skin:    General: Skin is warm.  Neurological:     Mental Status: He is alert.     Neurovascular status was found to be intact muscle strength adequate range of motion within normal limits.  Patient is noted to have exquisite discomfort in the posterior lateral aspect of the Achilles at insertion into the calcaneus with inflammation and is noted to have moderate equinus condition.  He does appear to have a functional hallux limitus condition bilateral with inflammation pain around the joint surface first MPJ bilateral.  Patient is found to have good digital perfusion well oriented x3     Assessment:  Acute Achilles tendinitis right with possibility of systemic gout or arthritis with history of ankylosing spondylitis along with functional hallux limitus condition left over right     Plan:  H&P and reviewed both conditions and x-rays.  At this time I discussed injection explaining the risk of injection associated with rupture and patient is willing to accept this.  I did a sterile prep of the posterior aspect of the heel I then carefully injected just the lateral side 3 mg dexamethasone 5 mg Xylocaine and advised on reduced activity.  Discussed  gout or arthritis and gave him information of things to watch out for with consideration for blood work and possible systemic medicine if symptoms persist or get worse.  Also educated him on hallux limitus of a functional nature and possible treatments  X-rays indicate that there is significant elevation elongation of the first metatarsal segment with no posterior spurring noted

## 2020-07-05 NOTE — Patient Instructions (Addendum)
[employer]'s Emergency Medicine: Concepts and Clinical Practice (9th ed., pp. 1392-1401). [place], [place]: [employer]. Retrieved from https://www.clinicalkey.com/#!/content/[email or web address]  Achilles Tendinitis  Achilles tendinitis is inflammation of the tough, cord-like band that attaches the lower leg muscles to the heel bone (Achilles tendon). This is usually caused by overusing the tendon and the ankle joint. Achilles tendinitis usually gets better over time with treatment and caring for yourself at home. It can take weeks or months to heal completely. What are the causes? This condition may be caused by:  A sudden increase in exercise or activity, such as running.  Doing the same exercises or activities, such as jumping, over and over.  Not warming up calf muscles before exercising.  Exercising in shoes that are worn out or not made for exercise.  Having arthritis or a bone growth (spur) on the back of the heel bone. This can rub against the tendon and hurt it.  Age-related wear and tear. Tendons become less flexible with age and are more likely to be injured. What are the signs or symptoms? Common symptoms of this condition include:  Pain in the Achilles tendon or in the back of the leg, just above the heel. The pain usually gets worse with exercise.  Stiffness or soreness in the back of the leg, especially in the morning.  Swelling of the skin over the Achilles tendon.  Thickening of the tendon.  Trouble standing on tiptoe. How is this diagnosed? This condition is diagnosed based on your symptoms and a physical exam. You may have tests, including:  X-rays.  MRI. How is this treated? The goal of treatment is to relieve symptoms and help your injury heal. Treatment may include:  Decreasing or stopping activities that caused the tendinitis. This may mean switching to low-impact exercises like biking or swimming.  Icing the injured  area.  Doing physical therapy, including strengthening and stretching exercises.  Taking NSAIDs, such as ibuprofen, to help relieve pain and swelling.  Using supportive shoes, wraps, heel lifts, or a walking boot (air cast).  Having surgery. This may be done if your symptoms do not improve after other treatments.  Using high-energy shock wave impulses to stimulate the healing process (extracorporeal shock wave therapy). This is rare.  Having an injection of medicines that help relieve inflammation (corticosteroids). This is rare. Follow these instructions at home: If you have an air cast:  Wear the air cast as told by your health care provider. Remove it only as told by your health care provider.  Loosen it if your toes tingle, become numb, or turn cold and blue.  Keep it clean.  If the air cast is not waterproof: ? Do not let it get wet. ? Cover it with a watertight covering when you take a bath or shower. Managing pain, stiffness, and swelling   If directed, put ice on the injured area. To do this: ? If you have a removable air cast, remove it as told by your health care provider. ? Put ice in a plastic bag. ? Place a towel between your skin and the bag. ? Leave the ice on for 20 minutes, 2-3 times a day.  Move your toes often to reduce stiffness and swelling.  Raise (elevate) your foot above the level of your heart while you are sitting or lying down. Activity  Gradually return to your normal activities as told by your health care provider. Ask your health care provider what activities are safe for you.  Do not do   activities that cause pain.  Consider doing low-impact exercises, like cycling or swimming.  Ask your health care provider when it is safe to drive if you have an air cast on your foot.  If physical therapy was prescribed, do exercises as told by your health care provider or physical therapist. General instructions  If directed, wrap your foot with an  elastic bandage or other wrap. This can help to keep your tendon from moving too much while it heals. Your health care provider will show you how to wrap your foot correctly.  Wear supportive shoes or heel lifts only as told by your health care provider.  Take over-the-counter and prescription medicines only as told by your health care provider.  Keep all follow-up visits as told by your health care provider. This is important. Contact a health care provider if you:  Have symptoms that get worse.  Have pain that does not get better with medicine.  Develop new, unexplained symptoms.  Develop warmth and swelling in your foot.  Have a fever. Get help right away if you:  Have a sudden popping sound or sensation in your Achilles tendon followed by severe pain.  Cannot move your toes or foot.  Cannot put any weight on your foot.  Your foot or toes become numb and look white or blue even after loosening your bandage or air cast. Summary  Achilles tendinitis is inflammation of the tough, cord-like band that attaches the lower leg muscles to the heel bone (Achilles tendon).  This condition is usually caused by overusing the tendon and the ankle joint. It can also be caused by arthritis or normal aging.  The most common symptoms of this condition include pain, swelling, or stiffness in the Achilles tendon or in the back of the leg.  This condition is usually treated by decreasing or stopping activities that caused the tendinitis, icing the injured area, taking NSAIDs, and doing physical therapy. This information is not intended to replace advice given to you by your health care provider. Make sure you discuss any questions you have with your health care provider. Document Revised: 02/22/2019 Document Reviewed: 02/22/2019 Elsevier Patient Education  2020 [employer]  Gout  Gout is painful swelling of your joints. Gout is a type of arthritis. It is caused by having too much uric acid  in your body. Uric acid is a chemical that is made when your body breaks down substances called purines. If your body has too much uric acid, sharp crystals can form and build up in your joints. This causes pain and swelling. Gout attacks can happen quickly and be very painful (acute gout). Over time, the attacks can affect more joints and happen more often (chronic gout). What are the causes?  Too much uric acid in your blood. This can happen because: ? Your kidneys do not remove enough uric acid from your blood. ? Your body makes too much uric acid. ? You eat too many foods that are high in purines. These foods include organ meats, some seafood, and beer.  Trauma or stress. What increases the risk?  Having a family history of gout.  Being male and middle-aged.  Being male and having gone through menopause.  Being very overweight (obese).  Drinking alcohol, especially beer.  Not having enough water in the body (being dehydrated).  Losing weight too quickly.  Having an organ transplant.  Having lead poisoning.  Taking certain medicines.  Having kidney disease.  Having a skin condition called psoriasis.  What are the signs or symptoms? An attack of acute gout usually happens in just one joint. The most common place is the big toe. Attacks often start at night. Other joints that may be affected include joints of the feet, ankle, knee, fingers, wrist, or elbow. Symptoms of an attack may include:  Very bad pain.  Warmth.  Swelling.  Stiffness.  Shiny, red, or purple skin.  Tenderness. The affected joint may be very painful to touch.  Chills and fever. Chronic gout may cause symptoms more often. More joints may be involved. You may also have white or yellow lumps (tophi) on your hands or feet or in other areas near your joints. How is this treated?  Treatment for this condition has two phases: treating an acute attack and preventing future attacks.  Acute gout  treatment may include: ? NSAIDs. ? Steroids. These are taken by mouth or injected into a joint. ? Colchicine. This medicine relieves pain and swelling. It can be given by mouth or through an IV tube.  Preventive treatment may include: ? Taking small doses of NSAIDs or colchicine daily. ? Using a medicine that reduces uric acid levels in your blood. ? Making changes to your diet. You may need to see a food expert (dietitian) about what to eat and drink to prevent gout. Follow these instructions at home: During a gout attack   If told, put ice on the painful area: ? Put ice in a plastic bag. ? Place a towel between your skin and the bag. ? Leave the ice on for 20 minutes, 2-3 times a day.  Raise (elevate) the painful joint above the level of your heart as often as you can.  Rest the joint as much as possible. If the joint is in your leg, you may be given crutches.  Follow instructions from your doctor about what you cannot eat or drink. Avoiding future gout attacks  Eat a low-purine diet. Avoid foods and drinks such as: ? Liver. ? Kidney. ? Anchovies. ? Asparagus. ? Herring. ? Mushrooms. ? Mussels. ? Beer.  Stay at a healthy weight. If you want to lose weight, talk with your doctor. Do not lose weight too fast.  Start or continue an exercise plan as told by your doctor. Eating and drinking  Drink enough fluids to keep your pee (urine) pale yellow.  If you drink alcohol: ? Limit how much you use to:  0-1 drink a day for women.  0-2 drinks a day for men. ? Be aware of how much alcohol is in your drink. In the [place] one drink equals one 12 oz bottle of beer (355 mL), one 5 oz glass of wine (148 mL), or one 1 oz glass of hard liquor (44 mL). General instructions  Take over-the-counter and prescription medicines only as told by your doctor.  Do not drive or use heavy machinery while taking prescription pain medicine.  Return to your normal activities as told by your  doctor. Ask your doctor what activities are safe for you.  Keep all follow-up visits as told by your doctor. This is important. Contact a doctor if:  You have another gout attack.  You still have symptoms of a gout attack after 10 days of treatment.  You have problems (side effects) because of your medicines.  You have chills or a fever.  You have burning pain when you pee (urinate).  You have pain in your lower back or belly. Get help right away if:  You have very bad pain.  Your pain cannot be controlled.  You cannot pee. Summary  Gout is painful swelling of the joints.  The most common site of pain is the big toe, but it can affect other joints.  Medicines and avoiding some foods can help to prevent and treat gout attacks. This information is not intended to replace advice given to you by your health care provider. Make sure you discuss any questions you have with your health care provider. Document Revised: 04/29/2018 Document Reviewed: 04/29/2018 Elsevier Patient Education  2020 [employer].

## 2020-07-12 DIAGNOSIS — I10 Essential (primary) hypertension: Secondary | ICD-10-CM | POA: Diagnosis not present

## 2020-07-12 DIAGNOSIS — Z95810 Presence of automatic (implantable) cardiac defibrillator: Secondary | ICD-10-CM | POA: Diagnosis not present

## 2020-07-12 DIAGNOSIS — R079 Chest pain, unspecified: Secondary | ICD-10-CM | POA: Diagnosis not present

## 2020-07-12 DIAGNOSIS — I422 Other hypertrophic cardiomyopathy: Secondary | ICD-10-CM | POA: Diagnosis not present

## 2020-08-17 ENCOUNTER — Ambulatory Visit (INDEPENDENT_AMBULATORY_CARE_PROVIDER_SITE_OTHER): Payer: BC Managed Care – PPO

## 2020-08-17 DIAGNOSIS — I429 Cardiomyopathy, unspecified: Secondary | ICD-10-CM

## 2020-08-17 DIAGNOSIS — I428 Other cardiomyopathies: Secondary | ICD-10-CM | POA: Diagnosis not present

## 2020-08-17 LAB — CUP PACEART REMOTE DEVICE CHECK
Battery Remaining Percentage: 79 %
Date Time Interrogation Session: 20211027183100
Implantable Lead Implant Date: 20191129
Implantable Lead Location: 753862
Implantable Lead Model: 3501
Implantable Lead Serial Number: 163240
Implantable Pulse Generator Implant Date: 20191129
Pulse Gen Serial Number: 251605

## 2020-08-22 NOTE — Progress Notes (Signed)
Remote ICD transmission.   

## 2020-09-28 ENCOUNTER — Other Ambulatory Visit: Payer: Self-pay | Admitting: Family

## 2020-09-28 DIAGNOSIS — U071 COVID-19: Secondary | ICD-10-CM

## 2020-09-28 NOTE — Progress Notes (Signed)
I connected by phone with Jake Huff on 09/28/2020 at 6:18 PM to discuss the potential use of a new treatment for mild to moderate COVID-19 viral infection in non-hospitalized patients.  This patient is a 44 y.o. male that meets the FDA criteria for Emergency Use Authorization of COVID monoclonal antibody casirivimab/imdevimab, bamlanivimab/eteseviamb, or sotrovimab.  Has a (+) direct SARS-CoV-2 viral test result  Has mild or moderate COVID-19   Is NOT hospitalized due to COVID-19  Is within 10 days of symptom onset  Has at least one of the high risk factor(s) for progression to severe COVID-19 and/or hospitalization as defined in EUA.  Specific high risk criteria : Cardiovascular disease or hypertension   Symptoms of flu like symptoms began 09/27/20.   I have spoken and communicated the following to the patient or parent/caregiver regarding COVID monoclonal antibody treatment:  1. FDA has authorized the emergency use for the treatment of mild to moderate COVID-19 in adults and pediatric patients with positive results of direct SARS-CoV-2 viral testing who are 54 years of age and older weighing at least 40 kg, and who are at high risk for progressing to severe COVID-19 and/or hospitalization.  2. The significant known and potential risks and benefits of COVID monoclonal antibody, and the extent to which such potential risks and benefits are unknown.  3. Information on available alternative treatments and the risks and benefits of those alternatives, including clinical trials.  4. Patients treated with COVID monoclonal antibody should continue to self-isolate and use infection control measures (e.g., wear mask, isolate, social distance, avoid sharing personal items, clean and disinfect "high touch" surfaces, and frequent handwashing) according to CDC guidelines.   5. The patient or parent/caregiver has the option to accept or refuse COVID monoclonal antibody treatment.  After reviewing  this information with the patient, the patient has agreed to receive one of the available covid 19 monoclonal antibodies and will be provided an appropriate fact sheet prior to infusion. Jake Stall, NP 09/28/2020 6:18 PM

## 2020-09-30 ENCOUNTER — Ambulatory Visit (HOSPITAL_COMMUNITY)
Admission: RE | Admit: 2020-09-30 | Discharge: 2020-09-30 | Disposition: A | Discharge status: Home / Self Care | Payer: BC Managed Care – PPO | Source: Ambulatory Visit | Attending: Pulmonary Disease | Admitting: Pulmonary Disease

## 2020-09-30 ENCOUNTER — Other Ambulatory Visit (HOSPITAL_COMMUNITY): Payer: Self-pay

## 2020-09-30 DIAGNOSIS — U071 COVID-19: Secondary | ICD-10-CM | POA: Diagnosis not present

## 2020-09-30 MED ORDER — METHYLPREDNISOLONE SODIUM SUCC 125 MG IJ SOLR: 125.0000 mg | Freq: Once | INTRAMUSCULAR | Status: DC | PRN

## 2020-09-30 MED ORDER — ALBUTEROL SULFATE HFA 108 (90 BASE) MCG/ACT IN AERS: 2.0000 | INHALATION_SPRAY | Freq: Once | RESPIRATORY_TRACT | Status: DC | PRN

## 2020-09-30 MED ORDER — SODIUM CHLORIDE 0.9 % IV SOLN: INTRAVENOUS | Status: DC | PRN

## 2020-09-30 MED ORDER — FAMOTIDINE IN NACL 20-0.9 MG/50ML-% IV SOLN: 20.0000 mg | Freq: Once | INTRAVENOUS | Status: DC | PRN

## 2020-09-30 MED ORDER — DIPHENHYDRAMINE HCL 50 MG/ML IJ SOLN: 50.0000 mg | Freq: Once | INTRAMUSCULAR | Status: DC | PRN

## 2020-09-30 MED ORDER — SODIUM CHLORIDE 0.9 % IV SOLN: 1200.0000 mg | Freq: Once | INTRAVENOUS | Status: DC

## 2020-09-30 MED ORDER — SODIUM CHLORIDE 0.9 % IV SOLN
1200.0000 mg | Freq: Once | INTRAVENOUS | Status: AC
  Administered 2020-09-30: 11:00:00 1200 mg via INTRAVENOUS
  Filled 2020-09-30: qty 10

## 2020-09-30 MED ORDER — EPINEPHRINE 0.3 MG/0.3ML IJ SOAJ: 0.3000 mg | Freq: Once | INTRAMUSCULAR | Status: DC | PRN

## 2020-09-30 NOTE — Discharge Instructions (Signed)
10 Things You Can Do to Manage Your COVID-19 Symptoms at Home If you have possible or confirmed COVID-19: 1. Stay home from work and school. And stay away from other public places. If you must go out, avoid using any kind of public transportation, ridesharing, or taxis. 2. Monitor your symptoms carefully. If your symptoms get worse, call your healthcare provider immediately. 3. Get rest and stay hydrated. 4. If you have a medical appointment, call the healthcare provider ahead of time and tell them that you have or may have COVID-19. 5. For medical emergencies, call 911 and notify the dispatch personnel that you have or may have COVID-19. 6. Cover your cough and sneezes with a tissue or use the inside of your elbow. 7. Wash your hands often with soap and water for at least 20 seconds or clean your hands with an alcohol-based hand sanitizer that contains at least 60% alcohol. 8. As much as possible, stay in a specific room and away from other people in your home. Also, you should use a separate bathroom, if available. If you need to be around other people in or outside of the home, wear a mask. 9. Avoid sharing personal items with other people in your household, like dishes, towels, and bedding. 10. Clean all surfaces that are touched often, like counters, tabletops, and doorknobs. Use household cleaning sprays or wipes according to the label instructions. cdc.gov/coronavirus 04/21/2019 This information is not intended to replace advice given to you by your health care provider. Make sure you discuss any questions you have with your health care provider. Document Revised: 09/23/2019 Document Reviewed: 09/23/2019 Elsevier Patient Education  2020 [employer] Inc. What types of side effects do monoclonal antibody drugs cause?  Common side effects  In general, the more common side effects caused by monoclonal antibody drugs include: . Allergic reactions, such as hives or itching . Flu-like signs and  symptoms, including chills, fatigue, fever, and muscle aches and pains . Nausea, vomiting . Diarrhea . Skin rashes . Low blood pressure   The CDC is recommending patients who receive monoclonal antibody treatments wait at least 90 days before being vaccinated.  Currently, there are no data on the safety and efficacy of mRNA COVID-19 vaccines in persons who received monoclonal antibodies or convalescent plasma as part of COVID-19 treatment. Based on the estimated half-life of such therapies as well as evidence suggesting that reinfection is uncommon in the 90 days after initial infection, vaccination should be deferred for at least 90 days, as a precautionary measure until additional information becomes available, to avoid interference of the antibody treatment with vaccine-induced immune responses. If you have any questions or concerns after the infusion please call the Advanced Practice Provider on call at [phone]. This number is ONLY intended for your use regarding questions or concerns about the infusion post-treatment side-effects.  Please do not provide this number to others for use. For return to work notes please contact your primary care provider.   If someone you know is interested in receiving treatment please have them call the COVID hotline at [phone].   

## 2020-09-30 NOTE — Progress Notes (Signed)
  Diagnosis: COVID-19  Physician: Patrick Wright, MD  Procedure: [facility] Med: casirivimab\imdevimab infusion - Provided patient with casirivimab\imdevimab fact sheet for patients, parents and caregivers prior to infusion.  Complications: No immediate complications noted.  Discharge: Discharged home   Versie Fleener N Gavriel Holzhauer 09/30/2020  

## 2020-09-30 NOTE — Progress Notes (Signed)
Patient reviewed Fact Sheet for Patients, Parents, and Caregivers for Emergency Use Authorization (EUA) of REGEN-COV for the Treatment of Coronavirus. Patient also reviewed and is agreeable to the estimated cost of treatment. Patient is agreeable to proceed.    

## 2020-10-12 ENCOUNTER — Other Ambulatory Visit: Payer: Self-pay | Admitting: Internal Medicine
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
## 2020-11-16 ENCOUNTER — Ambulatory Visit (INDEPENDENT_AMBULATORY_CARE_PROVIDER_SITE_OTHER): Payer: BC Managed Care – PPO

## 2020-11-16 DIAGNOSIS — I428 Other cardiomyopathies: Secondary | ICD-10-CM | POA: Diagnosis not present

## 2020-11-16 DIAGNOSIS — I429 Cardiomyopathy, unspecified: Secondary | ICD-10-CM

## 2020-11-20 LAB — CUP PACEART REMOTE DEVICE CHECK
Battery Remaining Percentage: 76 %
Date Time Interrogation Session: 20220131065000
Implantable Lead Implant Date: 20191129
Implantable Lead Location: 753862
Implantable Lead Model: 3501
Implantable Lead Serial Number: 163240
Implantable Pulse Generator Implant Date: 20191129
Pulse Gen Serial Number: 251605

## 2020-11-27 NOTE — Progress Notes (Signed)
Remote ICD transmission.   

## 2020-12-27 ENCOUNTER — Telehealth: Payer: Self-pay | Admitting: Internal Medicine

## 2020-12-27 NOTE — Telephone Encounter (Signed)
Pt's pharmacy is requesting a refill on omeprazole. Would Dr. Klein like to refill this medication? Please address 

## 2020-12-27 NOTE — Telephone Encounter (Signed)
*  STAT* If patient is at the pharmacy, call can be transferred to refill team.   1. Which medications need to be refilled? (please list name of each medication and dose if known)  omeprazole (PRILOSEC) 40 MG capsule  2. Which pharmacy/location (including street and city if local pharmacy) is medication to be sent to? [facility] [address] [place] - [address]  3. Do they need a 30 day or 90 day supply? 90

## 2021-01-10 NOTE — Telephone Encounter (Signed)
Patient called to follow up as he has not heard back from anyone. I made him aware, per Dr. Odessa Fleming nurse, this medication will have to be refilled by the prescribing provider or his PCP. Patient verbalized understanding. No further questions.

## 2021-02-15 ENCOUNTER — Ambulatory Visit (INDEPENDENT_AMBULATORY_CARE_PROVIDER_SITE_OTHER): Payer: BC Managed Care – PPO

## 2021-02-15 DIAGNOSIS — I422 Other hypertrophic cardiomyopathy: Secondary | ICD-10-CM

## 2021-02-19 LAB — CUP PACEART REMOTE DEVICE CHECK
Battery Remaining Percentage: 73 %
Date Time Interrogation Session: 20220430010000
Implantable Lead Implant Date: 20191129
Implantable Lead Location: 753862
Implantable Lead Model: 3501
Implantable Lead Serial Number: 163240
Implantable Pulse Generator Implant Date: 20191129
Pulse Gen Serial Number: 251605

## 2021-02-27 DIAGNOSIS — I472 Ventricular tachycardia: Secondary | ICD-10-CM | POA: Insufficient documentation

## 2021-02-27 DIAGNOSIS — I4729 Other ventricular tachycardia: Secondary | ICD-10-CM | POA: Insufficient documentation

## 2021-02-27 DIAGNOSIS — R000 Tachycardia, unspecified: Secondary | ICD-10-CM | POA: Insufficient documentation

## 2021-02-27 DIAGNOSIS — Z95810 Presence of automatic (implantable) cardiac defibrillator: Secondary | ICD-10-CM | POA: Insufficient documentation

## 2021-03-07 ENCOUNTER — Ambulatory Visit: Payer: BC Managed Care – PPO | Admitting: Internal Medicine

## 2021-03-07 ENCOUNTER — Other Ambulatory Visit: Payer: Self-pay

## 2021-03-07 VITALS — BP 118/71 | HR 71 | Ht 74.0 in | Wt 211.6 lb

## 2021-03-07 DIAGNOSIS — I472 Ventricular tachycardia: Secondary | ICD-10-CM

## 2021-03-07 DIAGNOSIS — R000 Tachycardia, unspecified: Secondary | ICD-10-CM | POA: Diagnosis not present

## 2021-03-07 DIAGNOSIS — I422 Other hypertrophic cardiomyopathy: Secondary | ICD-10-CM | POA: Diagnosis not present

## 2021-03-07 DIAGNOSIS — I4729 Other ventricular tachycardia: Secondary | ICD-10-CM

## 2021-03-07 DIAGNOSIS — Z95810 Presence of automatic (implantable) cardiac defibrillator: Secondary | ICD-10-CM | POA: Diagnosis not present

## 2021-03-07 NOTE — Progress Notes (Signed)
Patient Care Team: Rinaldo Cloud, MD as PCP - General (Cardiology)   HPI  Jake Huff is a 45 y.o. male Seen in follow-up for hypertrophic cardia myopathy without evidence of LVOT obstruction recently diagnosed and recurrent tachypalpitations. He is s/p SICD implant--device has been complicated by a class I recall on his lead with risk of fracture near the electrodes resulting in inappropriate shocks  Recurrent tachypalpitations >> ZIO Patch-->>> demonstrated sinus tachycardia. also  recurrent nonsustained ventricular tachycardia.  Treated in the past with metoprolol and interval up titration.  Using AliveCor monitor    The patient denies nocturnal dyspnea, orthopne or peripheral edema.  There have been no palpitations  or syncope.  Complains of  exertional chest pain and shortness of breath with lightheadedness he is supposed to be seeing the HCM clinic at [facility] next month.  Significant fatigue.  Wakes up unrested.  Sleep study was reviewed from 2 years ago.  Not formally read as best as I can tell had about 10 or 15  AH events but I think over time, the index is probably low.  Have reached out to Dr. Mayford Knife  His sister is s/p myectomy and defibrillator implantation at [facility] testing was positive for myosin binding protein mutation       Records and Results Reviewed he also has an appointment to see soon  Past Medical History:  Diagnosis Date  . Hypertension   . Hypertrophic cardiomyopathy (HCC)   . Sinus tachycardia ? autonomic   . SVT (supraventricular tachycardia) (HCC) s/p AV nodal modification     Past Surgical History:  Procedure Laterality Date  . FINGER SURGERY Left    ring finger  . SUBQ ICD IMPLANT N/A 09/18/2018   Procedure: SUBQ ICD IMPLANT;  Surgeon: Duke Salvia, MD;  Location: [facility] INVASIVE CV LAB;  Service: Cardiovascular;  Laterality: N/A;    Current Meds  Medication Sig  . Acetylcysteine (N-ACETYL-L-CYSTEINE) 600 MG CAPS Take  600 mg by mouth.  . ALPRAZolam (XANAX) 0.25 MG tablet Take 0.25 mg by mouth 2 (two) times daily as needed for anxiety.   Marland Kitchen atorvastatin (LIPITOR) 20 MG tablet Take 20 mg by mouth daily.  Marland Kitchen b complex vitamins capsule Take 1 capsule by mouth daily.  . cetirizine (ZYRTEC) 10 MG tablet Take 10 mg by mouth daily as needed for allergies.   Marland Kitchen ELDERBERRY PO Take by mouth.  . fluticasone (FLONASE) 50 MCG/ACT nasal spray Place 1 spray into both nostrils daily as needed for allergies or rhinitis.  . metoprolol succinate (TOPROL-XL) 100 MG 24 hr tablet Take 1 tablet (100 mg total) by mouth in the morning and at bedtime. Take with or immediately following a meal.  . omeprazole (PRILOSEC) 40 MG capsule Take 40 mg by mouth every evening.  . propranolol (INDERAL) 20 MG tablet TAKE 1 TABLET BY MOUTH EVERY 4 HOURS AS NEEDED(FOR TACHY PALPITATIONS)  . verapamil (VERELAN PM) 180 MG 24 hr capsule Take 180 mg by mouth daily.    Allergies  Allergen Reactions  . Penicillins Anaphylaxis, Hives and Swelling    Has patient had a PCN reaction causing immediate rash, facial/tongue/throat swelling, SOB or lightheadedness with hypotension: Yes Has patient had a PCN reaction causing severe rash involving mucus membranes or skin necrosis: No Has patient had a PCN reaction that required hospitalization: No Has patient had a PCN reaction occurring within the last 10 years: No If all of the above answers are "NO", then may  proceed with Cephalosporin use.       Review of Systems negative except from HPI and PMH  Physical Exam BP 118/71   Pulse 71   Ht 6\' 2"  (1.88 m)   Wt 211 lb 9.6 oz (96 kg)   BMI 27.17 kg/m   Well developed and well nourished in no acute distress HENT normal Neck supple with JVP-flat Clear Device pocket well healed; without hematoma or erythema.  There is no tethering  Regular rate and rhythm, no  gallop No murmur Abd-soft with active BS No Clubbing cyanosis  edema Skin-warm and dry A &  Oriented  Grossly normal sensory and motor function  ECG sinus at 75 16/09/43 Lateral Q waves   MRI was reviewed with the patient.  He has focal septal hypertrophy.  Assessment and  Plan  Hypertrophic cardiomyopathy with gadolinium enhancement  VT nonsustained  SICD      Class I recall for risk of lead fracture (occurring just distal to the proximal electrode resulting in inappropriate shocks and an inability to deliver an appropriate shock--to exclude the distal electrode resulting no warning pretended by inappropriate shocks)  Sinus tachycardia doubt atrial tachycardia probably autonomic  Chest pain  Fatigue  No interval episodes of ventricular tachycardia.  Sinus tachycardia it is much controlled on the combination of verapamil and metoprolol.  I wonder whether the metoprolol is contributing to his fatigue.  However, given the fact that his tachycardia is controlled he would like to continue his current medication regime for now so we will renew.  In the fall, we will discuss again alternative beta-blockers to see if we can address the fatigue that way.  In this regard, I have reviewed his sleep study with him.  I reached out to Dr. 18/09/43 for her formal impression.  Chest pain is concerning.  He may be a candidate for new therapies.  He is to see the HCM clinic at [facility] next month and so will defer this evaluation to them.  Thankfully, his daughter has tested negative for the MB P3 mutation         The patient's SICD lead is subject to a grade one recall because of risk of fracture of the lead just distal to the proximal electrode o  resulting in potential for inappropriate shocks and an inability to deliver an appropriate shock.  We have discussed the risk of this event and given the patients status as primary  we have elected to continue to monitor and not   replace the lead at this time. We have discussed the benefits and risks of programming sensing to include or  exclude the distal electrode.  In its exclusion, there would be no warnings from inappropriate electrograms but there is no risk of inappropriate shocks.  Inclusion results in the opposite.   We have reviewed the importance of regular interrogation of the device so as to have some opportunity for alerts, have demonstrated the beep tones and stressed the importance of [facility] checking BEEP tones following MRI.   He has elected to keep it in a secondary configuration  Much less dyspnea on the verapamil.          Current medicines are reviewed at length with the patient today .  The patient does not  have concerns regarding medicines.

## 2021-03-07 NOTE — Patient Instructions (Signed)

## 2021-03-08 NOTE — Progress Notes (Signed)
Remote ICD transmission.   

## 2021-03-28 ENCOUNTER — Ambulatory Visit: Payer: BC Managed Care – PPO | Admitting: Podiatry

## 2021-04-03 ENCOUNTER — Other Ambulatory Visit: Payer: Self-pay | Admitting: Internal Medicine

## 2021-04-21 ENCOUNTER — Other Ambulatory Visit: Payer: Self-pay | Admitting: Internal Medicine

## 2021-04-25 ENCOUNTER — Telehealth: Payer: Self-pay | Admitting: Internal Medicine

## 2021-04-25 MED ORDER — METOPROLOL SUCCINATE ER 100 MG PO TB24: ORAL_TABLET | ORAL | 3 refills | Status: DC

## 2021-04-25 NOTE — Telephone Encounter (Signed)
Pt's medication was sent to pt's pharmacy as requested. Confirmation received.  °

## 2021-04-25 NOTE — Telephone Encounter (Signed)
*  STAT* If patient is at the pharmacy, call can be transferred to refill team.   1. Which medications need to be refilled? (please list name of each medication and dose if known) Metoprolol 100 mg  2. Which pharmacy/location (including street and city if local pharmacy) is medication to be sent to? [facility]  3. Do they need a 30 day or 90 day supply? 90   Patient is out

## 2021-05-17 ENCOUNTER — Ambulatory Visit (INDEPENDENT_AMBULATORY_CARE_PROVIDER_SITE_OTHER): Payer: BC Managed Care – PPO

## 2021-05-17 DIAGNOSIS — I422 Other hypertrophic cardiomyopathy: Secondary | ICD-10-CM | POA: Diagnosis not present

## 2021-05-21 LAB — CUP PACEART REMOTE DEVICE CHECK
Battery Remaining Percentage: 70 %
Date Time Interrogation Session: 20220731131000
Implantable Lead Implant Date: 20191129
Implantable Lead Location: 753862
Implantable Lead Model: 3501
Implantable Lead Serial Number: 163240
Implantable Pulse Generator Implant Date: 20191129
Pulse Gen Serial Number: 251605

## 2021-06-13 NOTE — Progress Notes (Signed)
Remote ICD transmission.   

## 2021-07-26 NOTE — Progress Notes (Signed)
Office Visit Note  Patient: Jake Huff             Date of Birth: [birth year]           MRN: [id number]             PCP: Charolette Forward, MD Referring: Charolette Forward, MD Visit Date: 08/09/2021 Occupation: @GUAROCC @  Subjective:  Pain in multiple joints and uveitis   History of Present Illness: Jake Huff is a 45 y.o. male seen in consultation per request of his cardiologist.  According the patient at age 21 he was diagnosed with uveitis.  At the time he had HLA-B27 which was positive.  At age 62 he had lower back pain which lingered on off and on.  At age 74 the uveitis became more severe to the point he was admitted at [facility] at that time the rheumatology team came and evaluated him according the patient the x-rays of the SI joints showed fusion of the SI joints and some changes in the lumbar spine were noted.  He was advised Humira by the rheumatologist but he opted not to take the medication at the time.  He states the joint pain gradually improved.  In 2019 he developed left foot swelling which was not his great left great toe.  He was seen by Dr. Babs Bertin who did uric acid level which was 7.5 he was given steroid injection and the symptoms resolved after 2 days.  He states since then he has had about 3 episodes per year of discomfort in his feet which has been mostly in his heel, Achilles tendon and dorsum of his feet.  Is also had an episode in his left ankle joint.  The last episode was in June 2022 which was in the left great toe.  He states that Ro joint was aspirated and the crystals were negative.  1 month ago he started having pain in his left knee joint which lasted only for 1 day and for the last week he has been having increased lower back pain.  He states that uveitis episodes have become more frequent now he has been having about 4-5 episodes per year.  The last episode was about 2 weeks ago.  He has been under care of Dr. Tomi Likens and ophthalmologist at [facility] and Dr.  Baird Cancer locally. He was also diagnosed with cardiomyopathy at age 54 although he had had symptoms for a long time.  He has been under care off a cardiologist in [place] for cardiomyopathy and Dr. Caryl Comes for arrhythmias. There is positive family history of ankylosing spondylitis in his father, sister and a brother.  There is history of cardiomyopathy in his father, sister and brother.  Activities of Daily Living:  Patient reports morning stiffness for several hours.   Patient Reports nocturnal pain.  Difficulty dressing/grooming: Reports Difficulty climbing stairs: Reports Difficulty getting out of chair: Reports Difficulty using hands for taps, buttons, cutlery, and/or writing: Denies  Review of Systems  Constitutional:  Positive for fatigue.  HENT:  Negative for mouth sores, mouth dryness and nose dryness.   Eyes:  Positive for photophobia, pain, redness and dryness. Negative for itching.  Respiratory:  Positive for shortness of breath. Negative for difficulty breathing.   Cardiovascular:  Positive for palpitations. Negative for chest pain.  Gastrointestinal:  Positive for diarrhea and heartburn. Negative for blood in stool and constipation.  Endocrine: Negative for increased urination.  Genitourinary:  Negative for difficulty urinating.  Musculoskeletal:  Positive for morning  stiffness. Negative for joint pain, joint pain, joint swelling, myalgias, muscle tenderness and myalgias.  Skin:  Negative for color change, rash, redness and sensitivity to sunlight.  Allergic/Immunologic: Negative for susceptible to infections.  Neurological:  Positive for dizziness and memory loss. Negative for numbness and headaches.  Hematological:  Negative for bruising/bleeding tendency and swollen glands.  Psychiatric/Behavioral:  Negative for depressed mood, confusion and sleep disturbance. The patient is nervous/anxious.    PMFS History:  Patient Active Problem List   Diagnosis Date Noted   NSVT  (nonsustained ventricular tachycardia) 02/27/2021   Sinus tachycardia 02/27/2021   ICD (implantable cardioverter-defibrillator) in place 02/27/2021   GERD (gastroesophageal reflux disease) 07/05/2020   Hypertrophic cardiomyopathy ([place]) 01/10/2020   Palpitations 01/10/2020   HYPERCHOLESTEROLEMIA 11/30/2009   Primary cardiomyopathy ([place]) 11/30/2009   CHEST PAIN, ATYPICAL, HX OF 11/30/2009    Past Medical History:  Diagnosis Date   Hypertension    Hypertrophic cardiomyopathy ([place])    Sinus tachycardia ? autonomic    SVT (supraventricular tachycardia) (HCC) s/p AV nodal modification     Family History  Problem Relation Age of Onset   Heart disease Father    Ankylosing spondylitis Father    Ankylosing spondylitis Sister    Heart disease Sister    Heart disease Brother    Healthy Daughter    Past Surgical History:  Procedure Laterality Date   FINGER SURGERY Left    ring finger   SUBQ ICD IMPLANT N/A 09/18/2018   Procedure: SUBQ ICD IMPLANT;  Surgeon: Deboraha Sprang, MD;  Location: [facility] CV LAB;  Service: Cardiovascular;  Laterality: N/A;   Social History   Social History Narrative   Not on file    There is no immunization history on file for this patient.   Objective: Vital Signs: BP 121/83 (BP Location: Left Arm, Patient Position: Sitting, Cuff Size: Normal)   Pulse 73   Ht $R'6\' 1"'dE$  (1.854 m)   Wt 216 lb (98 kg)   BMI 28.50 kg/m    Physical Exam Vitals and nursing note reviewed.  Constitutional:      Appearance: He is well-developed.  HENT:     Head: Normocephalic and atraumatic.  Eyes:     Conjunctiva/sclera: Conjunctivae normal.     Pupils: Pupils are equal, round, and reactive to light.  Cardiovascular:     Rate and Rhythm: Normal rate and regular rhythm.     Heart sounds: Normal heart sounds.     Comments: Defibrillator Pulmonary:     Effort: Pulmonary effort is normal.     Breath sounds: Normal breath sounds.  Abdominal:     General: Bowel sounds  are normal.     Palpations: Abdomen is soft.  Musculoskeletal:     Cervical back: Normal range of motion and neck supple.  Skin:    General: Skin is warm and dry.     Capillary Refill: Capillary refill takes less than 2 seconds.  Neurological:     Mental Status: He is alert and oriented to person, place, and time.  Psychiatric:        Behavior: Behavior normal.     Musculoskeletal Exam: He had discomfort range of motion of his cervical spine.  He had limited range of motion of thoracic and lumbar spine.  Schober's was 3 cm.  There was tenderness on palpation of her SI joints.  Shoulder joints, elbow joints, wrist joints, MCPs PIPs and DIPs with good range of motion.  He is contracture in his left  fourth PIP from previous injury.  No synovitis was noted.  Hip joints and knee joints with good range of motion without any warmth swelling or effusion.  There was no evidence of Achilles tendinitis, Planter fasciitis.  There was no tenderness over MTPs.  No synovitis was noted.  CDAI Exam: CDAI Score: -- Patient Global: --; Provider Global: -- Swollen: --; Tender: -- Joint Exam 08/09/2021   No joint exam has been documented for this visit   There is currently no information documented on the homunculus. Go to the Rheumatology activity and complete the homunculus joint exam.  Investigation: No additional findings.  Imaging: No results found.  Recent Labs: Lab Results  Component Value Date   WBC 8.9 04/14/2019   HGB 15.7 04/14/2019   PLT 297 04/14/2019   NA 140 04/14/2019   K 4.1 04/14/2019   CL 104 04/14/2019   CO2 24 04/14/2019   GLUCOSE 111 (H) 04/14/2019   BUN 9 04/14/2019   CREATININE 0.94 04/14/2019   BILITOT 0.8 12/06/2011   ALKPHOS 64 12/06/2011   AST 18 12/06/2011   ALT 15 12/06/2011   PROT 7.9 12/06/2011   ALBUMIN 4.8 12/06/2011   CALCIUM 9.7 04/14/2019   GFRAA >60 04/14/2019    Speciality Comments: No specialty comments available.  Procedures:  No procedures  performed Allergies: Penicillins   Assessment / Plan:     Visit Diagnoses: Ankylosing spondylitis of multiple sites in spine (HCC) -HLA-B27 positive.  04/14/19: ESR 2, CRP<0.8. Dr. Charlestine Night. -Patient states that he was diagnosed with ankylosing spondylitis when he was 45 years old at [facility] while he was admitted for uveitis.  At that time the x-ray showed fusion of the SI joints and lumbar spine changes.  He was offered Humira but he did not want to take the medication.  He is joint pain resolved after few years and then recurred.  He has been having recurrent pain in his feet since 2019.  He has had to have problems with toe swelling, Achilles tendinitis and plantar fasciitis.  He was evaluated by a podiatrist in the past.  There was also question raised about gout.  Aspiration of the joint was negative for crystals.  He has been also having increased pain and discomfort in his left knee joint off and on and SI joints now.  We had detailed discussion regarding ankylosing spondylitis and different treatment options were discussed.  As he has history of uveitis and having frequent flares of uveitis I discussed the option of Humira.  A handout on Humira was given to review.  Side effects were also discussed.  I will obtain labs today.  Plan: Sedimentation rate  Uveitis -he gives history of recurrent uveitis since he was 45 years old.  The uveitis flares have become more prominent now.  He has been under care of Dr. Baird Cancer locally and Dr. Tomi Likens at [facility].  His last flare was about 2 weeks ago which was treated with Durezol resolved.   High risk medication use -patient was offered MTX, Enbrel, and Humira in the past but he declined treatment.. -I will obtain labs today in anticipation to start him on immunosuppressive therapy.  A handout on Humira was given today.  Plan: CBC with Differential/Platelet, COMPLETE METABOLIC PANEL WITH GFR, Hepatitis B surface antigen, Hepatitis C antibody, Hepatitis B  core antibody, IgM, QuantiFERON-TB Gold Plus, Serum protein electrophoresis with reflex, IgG, IgA, IgM, HIV Antibody (routine testing w rflx)  Pain in cervical spine -he gives history  of pain and stiffness in his cervical spine.  He had good range of motion.  Plan: XR Cervical Spine 2 or 3 views.  X-rays of the cervical spine were unremarkable.  Pain in thoracic spine -gives history of chronic thoracic pain.  He had limited range of motion.  Plan: XR Thoracic Spine 2 View.  X-rays showed mild degenerative changes.  Chronic midline low back pain without sciatica -he gives history of chronic lower back pain.  He had limited range of motion and Schober's was 3 cm.  Plan: XR Lumbar Spine 2-3 Views.  X-rays were consistent with facet joint arthropathy.  Chronic SI joint pain -gives history of chronic SI joint pain for many years.  He has been having increased SI joint pain in the last few weeks.  Plan: XR Pelvis 1-2 Views.  Bilateral SI joint sclerosis with no joint space narrowing or erosive changes were noted.  Pain in both hands -he has had off-and-on stiffness in his hands.  Left fourth PIP contracture was noted due to previous injury.  No synovitis was noted.  Plan: XR Hand 2 View Right, XR Hand 2 View Left.  Posttraumatic changes noted in the right fourth PIP joint.  Osteoarthritis was noted in bilateral hands.  Pain in both feet -has been having recurrent problems with pain in both feet for the last 2 to 3 years.  He has had episodes of toe swelling, Achilles tendinitis and plantar fasciitis.  He has been also seen by podiatrist.  Joint aspiration for uric acid crystals was negative.  He had mild hyperuricemia in the past.  Plan: XR Foot 2 Views Right, XR Foot 2 Views.  X-rays are consistent with bilateral early osteoarthritis.  Left, Uric acid, Rheumatoid factor, Cyclic citrul peptide antibody, IgG  Essential hypertension-his blood pressure is normal today.  Hypertrophic cardiomyopathy ([place]) -  genetic.  He was diagnosed at the age 89.  There is positive history of cardiomyopathy in his father and his sister.  His brother passed away from the complications of cardiomyopathy.  Chronic atrial fibrillation (HCC)-he is followed by Dr. Adam Phenix.  NSVT (nonsustained ventricular tachycardia)  ICD (implantable cardioverter-defibrillator) in place  History of hyperlipidemia  History of gastroesophageal reflux (GERD)  Former smoker - 1/2 PPD x 10 years, quit 2009.  Family history of ankylosing spondylitis-father, sister and brother  Family history of cardiomyopathy-father, sister and brother  Orders: Orders Placed This Encounter  Procedures   XR Lumbar Spine 2-3 Views   XR Pelvis 1-2 Views   XR Foot 2 Views Right   XR Foot 2 Views Left   XR Hand 2 View Right   XR Hand 2 View Left   XR Cervical Spine 2 or 3 views   XR Thoracic Spine 2 View   CBC with Differential/Platelet   COMPLETE METABOLIC PANEL WITH GFR   Sedimentation rate   Uric acid   Rheumatoid factor   Cyclic citrul peptide antibody, IgG   Hepatitis B surface antigen   Hepatitis C antibody   Hepatitis B core antibody, IgM   QuantiFERON-TB Gold Plus   Serum protein electrophoresis with reflex   IgG, IgA, IgM   HIV Antibody (routine testing w rflx)    No orders of the defined types were placed in this encounter.   Face-to-face time spent with patient was 50 minutes. Greater than 50% of time was spent in counseling and coordination of care.  Follow-Up Instructions: Return for Ankylosing spondylitis, uveitis.   Bo Merino, MD  Note - This record has been created using Dragon software.  Chart creation errors have been sought, but may not always  have been located. Such creation errors do not reflect on  the standard of medical care.  

## 2021-08-09 ENCOUNTER — Other Ambulatory Visit: Payer: Self-pay

## 2021-08-09 ENCOUNTER — Ambulatory Visit: Payer: 59 | Admitting: Rheumatology

## 2021-08-09 ENCOUNTER — Ambulatory Visit: Payer: Self-pay

## 2021-08-09 ENCOUNTER — Encounter: Payer: Self-pay | Admitting: Rheumatology

## 2021-08-09 VITALS — BP 121/83 | HR 73 | Ht 73.0 in | Wt 216.0 lb

## 2021-08-09 DIAGNOSIS — M79642 Pain in left hand: Secondary | ICD-10-CM

## 2021-08-09 DIAGNOSIS — I422 Other hypertrophic cardiomyopathy: Secondary | ICD-10-CM

## 2021-08-09 DIAGNOSIS — G8929 Other chronic pain: Secondary | ICD-10-CM

## 2021-08-09 DIAGNOSIS — M79671 Pain in right foot: Secondary | ICD-10-CM

## 2021-08-09 DIAGNOSIS — M546 Pain in thoracic spine: Secondary | ICD-10-CM | POA: Diagnosis not present

## 2021-08-09 DIAGNOSIS — M545 Low back pain, unspecified: Secondary | ICD-10-CM

## 2021-08-09 DIAGNOSIS — H209 Unspecified iridocyclitis: Secondary | ICD-10-CM | POA: Diagnosis not present

## 2021-08-09 DIAGNOSIS — M79641 Pain in right hand: Secondary | ICD-10-CM

## 2021-08-09 DIAGNOSIS — Z87891 Personal history of nicotine dependence: Secondary | ICD-10-CM

## 2021-08-09 DIAGNOSIS — M533 Sacrococcygeal disorders, not elsewhere classified: Secondary | ICD-10-CM | POA: Diagnosis not present

## 2021-08-09 DIAGNOSIS — M542 Cervicalgia: Secondary | ICD-10-CM

## 2021-08-09 DIAGNOSIS — Z8269 Family history of other diseases of the musculoskeletal system and connective tissue: Secondary | ICD-10-CM

## 2021-08-09 DIAGNOSIS — I4729 Other ventricular tachycardia: Secondary | ICD-10-CM

## 2021-08-09 DIAGNOSIS — Z720 Tobacco use: Secondary | ICD-10-CM

## 2021-08-09 DIAGNOSIS — I10 Essential (primary) hypertension: Secondary | ICD-10-CM | POA: Diagnosis not present

## 2021-08-09 DIAGNOSIS — Z8249 Family history of ischemic heart disease and other diseases of the circulatory system: Secondary | ICD-10-CM

## 2021-08-09 DIAGNOSIS — Z79899 Other long term (current) drug therapy: Secondary | ICD-10-CM | POA: Diagnosis not present

## 2021-08-09 DIAGNOSIS — Z95810 Presence of automatic (implantable) cardiac defibrillator: Secondary | ICD-10-CM

## 2021-08-09 DIAGNOSIS — Z8639 Personal history of other endocrine, nutritional and metabolic disease: Secondary | ICD-10-CM

## 2021-08-09 DIAGNOSIS — M79672 Pain in left foot: Secondary | ICD-10-CM | POA: Diagnosis not present

## 2021-08-09 DIAGNOSIS — Z8719 Personal history of other diseases of the digestive system: Secondary | ICD-10-CM

## 2021-08-09 DIAGNOSIS — M450 Ankylosing spondylitis of multiple sites in spine: Secondary | ICD-10-CM | POA: Diagnosis not present

## 2021-08-09 DIAGNOSIS — I482 Chronic atrial fibrillation, unspecified: Secondary | ICD-10-CM

## 2021-08-09 NOTE — Patient Instructions (Signed)
Adalimumab Injection What is this medication? ADALIMUMAB (ay da LIM yoo mab) is used to treat rheumatoid and psoriatic arthritis. It is also used to treat ankylosing spondylitis, Crohn's disease, ulcerative colitis, plaque psoriasis, hidradenitis suppurativa, and uveitis. This medicine may be used for other purposes; ask your health care provider or pharmacist if you have questions. COMMON BRAND NAME(S): CYLTEZO, Humira What should I tell my care team before I take this medication? They need to know if you have any of these conditions: cancer diabetes (high blood sugar) having surgery heart disease hepatitis B immune system problems infections, such as tuberculosis (TB) or other bacterial, fungal, or viral infections multiple sclerosis recent or upcoming vaccine an unusual reaction to adalimumab, mannitol, latex, rubber, other medicines, foods, dyes, or preservatives pregnant or trying to get pregnant breast-feeding How should I use this medication? This medicine is for injection under the skin. You will be taught how to prepare and give it. Take it as directed on the prescription label. Keep taking it unless your health care provider tells you to stop. It is important that you put your used needles and syringes in a special sharps container. Do not put them in a trash can. If you do not have a sharps container, call your pharmacist or health care provider to get one. This medicine comes with INSTRUCTIONS FOR USE. Ask your pharmacist for directions on how to use this medicine. Read the information carefully. Talk to your pharmacist or health care provider if you have questions. A special MedGuide will be given to you by the pharmacist with each prescription and refill. Be sure to read this information carefully each time. Talk to your pediatrician regarding the use of this medicine in children. While this drug may be prescribed for children as young as 2 years for selected conditions,  precautions do apply. Overdosage: If you think you have taken too much of this medicine contact a poison control center or emergency room at once. NOTE: This medicine is only for you. Do not share this medicine with others. What if I miss a dose? If you miss a dose, take it as soon as you can. If it is almost time for your next dose, take only that dose. Do not take double or extra doses. It is important not to miss any doses. Talk to your health care provider about what to do if you miss a dose. What may interact with this medication? Do not take this medicine with any of the following medications: abatacept anakinra biologic medicines such as certolizumab, etanercept, golimumab, infliximab live virus vaccines This medicine may also interact with the following medications: cyclosporine theophylline vaccines warfarin This list may not describe all possible interactions. Give your health care provider a list of all the medicines, herbs, non-prescription drugs, or dietary supplements you use. Also tell them if you smoke, drink alcohol, or use illegal drugs. Some items may interact with your medicine. What should I watch for while using this medication? Visit your health care provider for regular checks on your progress. Tell your health care provider if your symptoms do not start to get better or if they get worse. You will be tested for tuberculosis (TB) before you start this medicine. If your doctor prescribes any medicine for TB, you should start taking the TB medicine before starting this medicine. Make sure to finish the full course of TB medicine. This medicine may increase your risk of getting an infection. Call your health care provider for advice if you  get a fever, chills, sore throat, or other symptoms of a cold or flu. Do not treat yourself. Try to avoid being around people who are sick. Talk to your health care provider about your risk of cancer. You may be more at risk for certain  types of cancer if you take this medicine. What side effects may I notice from receiving this medication? Side effects that you should report to your doctor or health care professional as soon as possible: allergic reactions like skin rash, itching or hives, swelling of the face, lips, or tongue changes in vision chest pain dizziness heart failure (trouble breathing; fast, irregular heartbeat; sudden weight gain; swelling of the ankles, feet, hands; unusually weak or tired) infection (fever, chills, cough, sore throat, pain or trouble passing urine) liver injury (dark yellow or brown urine; general ill feeling or flu-like symptoms; loss of appetite, right upper belly pain; unusually weak or tired, yellowing of the eyes or skin) lump or swollen lymph nodes on the neck, groin, or underarm area muscle weakness pain, tingling, numbness in the hands or feet red, scaly patches or raised bumps on the skin trouble breathing unusual bleeding or bruising unusually weak or tired Side effects that usually do not require medical attention (report to your doctor or health care professional if they continue or are bothersome): headache nausea pain, redness, or irritation at site where injected stuffy or runny nose This list may not describe all possible side effects. Call your doctor for medical advice about side effects. You may report side effects to FDA at 1-800-FDA-1088. Where should I keep my medication? Keep out of the reach of children and pets. Store in the refrigerator between 2 and 8 degrees C (36 and 46 degrees F). Do not freeze. Keep this medicine in the original packaging until you are ready to take it. Protect from light. Get rid of any unused medicine after the expiration date. This medicine may be stored at room temperature for up to 14 days. Keep this medicine in the original packaging. Protect from light. If it is stored at room temperature, get rid of any unused medicine after 14 days  or after it expires, whichever is first. To get rid of medicines that are no longer needed or have expired: Take the medicine to a medicine take-back program. Check with your pharmacy or law enforcement to find a location. If you cannot return the medicine, ask your pharmacist or health care provider how to get rid of this medicine safely. NOTE: This sheet is a summary. It may not cover all possible information. If you have questions about this medicine, talk to your doctor, pharmacist, or health care provider.  Vaccines You are taking a medication(s) that can suppress your immune system.  The following immunizations are recommended: Flu annually Covid-19  Td/Tdap (tetanus, diphtheria, pertussis) every 10 years Pneumonia (Prevnar 15 then Pneumovax 23 at least 1 year apart.  Alternatively, can take Prevnar 20 without needing additional dose) Shingrix: 2 doses from 4 weeks to 6 months apart  Please check with your PCP to make sure you are up to date.

## 2021-08-13 LAB — CBC WITH DIFFERENTIAL/PLATELET
Absolute Monocytes: 585 cells/uL (ref 200–950)
Basophils Absolute: 53 cells/uL (ref 0–200)
Basophils Relative: 0.7 %
Eosinophils Absolute: 84 cells/uL (ref 15–500)
Eosinophils Relative: 1.1 %
HCT: 48.1 % (ref 38.5–50.0)
Hemoglobin: 15.5 g/dL (ref 13.2–17.1)
Lymphs Abs: 2417 cells/uL (ref 850–3900)
MCH: 30.3 pg (ref 27.0–33.0)
MCHC: 32.2 g/dL (ref 32.0–36.0)
MCV: 93.9 fL (ref 80.0–100.0)
MPV: 8.8 fL (ref 7.5–12.5)
Monocytes Relative: 7.7 %
Neutro Abs: 4461 cells/uL (ref 1500–7800)
Neutrophils Relative %: 58.7 %
Platelets: 331 10*3/uL (ref 140–400)
RBC: 5.12 10*6/uL (ref 4.20–5.80)
RDW: 11.9 % (ref 11.0–15.0)
Total Lymphocyte: 31.8 %
WBC: 7.6 10*3/uL (ref 3.8–10.8)

## 2021-08-13 LAB — COMPLETE METABOLIC PANEL WITH GFR
AG Ratio: 1.6 (calc) (ref 1.0–2.5)
ALT: 42 U/L (ref 9–46)
AST: 29 U/L (ref 10–40)
Albumin: 4.4 g/dL (ref 3.6–5.1)
Alkaline phosphatase (APISO): 56 U/L (ref 36–130)
BUN: 10 mg/dL (ref 7–25)
CO2: 26 mmol/L (ref 20–32)
Calcium: 9.4 mg/dL (ref 8.6–10.3)
Chloride: 103 mmol/L (ref 98–110)
Creat: 0.86 mg/dL (ref 0.60–1.29)
Globulin: 2.8 g/dL (calc) (ref 1.9–3.7)
Glucose, Bld: 83 mg/dL (ref 65–99)
Potassium: 4.2 mmol/L (ref 3.5–5.3)
Sodium: 142 mmol/L (ref 135–146)
Total Bilirubin: 0.8 mg/dL (ref 0.2–1.2)
Total Protein: 7.2 g/dL (ref 6.1–8.1)
eGFR: 109 mL/min/{1.73_m2} (ref >=60)

## 2021-08-13 LAB — PROTEIN ELECTROPHORESIS, SERUM, WITH REFLEX
Albumin ELP: 4.3 g/dL (ref 3.8–4.8)
Alpha 1: 0.3 g/dL (ref 0.2–0.3)
Alpha 2: 0.7 g/dL (ref 0.5–0.9)
Beta 2: 0.5 g/dL (ref 0.2–0.5)
Beta Globulin: 0.5 g/dL (ref 0.4–0.6)
Gamma Globulin: 1.2 g/dL (ref 0.8–1.7)
Total Protein: 7.4 g/dL (ref 6.1–8.1)

## 2021-08-13 LAB — URIC ACID: Uric Acid, Serum: 8.8 mg/dL — ABNORMAL HIGH (ref 4.0–8.0)

## 2021-08-13 LAB — QUANTIFERON-TB GOLD PLUS
Mitogen-NIL: 9.81 IU/mL
NIL: 0.04 IU/mL
QuantiFERON-TB Gold Plus: NEGATIVE
TB1-NIL: 0 IU/mL
TB2-NIL: <0 IU/mL

## 2021-08-13 LAB — RHEUMATOID FACTOR: Rheumatoid fact SerPl-aCnc: <14 IU/mL (ref <14)

## 2021-08-13 LAB — SEDIMENTATION RATE: Sed Rate: 2 mm/h (ref 0–15)

## 2021-08-13 LAB — IGG, IGA, IGM
IgG (Immunoglobin G), Serum: 1339 mg/dL (ref 600–1640)
IgM, Serum: 43 mg/dL — ABNORMAL LOW (ref 50–300)
Immunoglobulin A: 355 mg/dL — ABNORMAL HIGH (ref 47–310)

## 2021-08-13 LAB — HEPATITIS C ANTIBODY
Hepatitis C Ab: NONREACTIVE
SIGNAL TO CUT-OFF: 0.06 (ref <1.00)

## 2021-08-13 LAB — HEPATITIS B CORE ANTIBODY, IGM: Hep B C IgM: NONREACTIVE

## 2021-08-13 LAB — CYCLIC CITRUL PEPTIDE ANTIBODY, IGG: Cyclic Citrullin Peptide Ab: <16 UNITS

## 2021-08-13 LAB — HIV ANTIBODY (ROUTINE TESTING W REFLEX): HIV 1&2 Ab, 4th Generation: NONREACTIVE

## 2021-08-13 LAB — HEPATITIS B SURFACE ANTIGEN: Hepatitis B Surface Ag: NONREACTIVE

## 2021-08-16 ENCOUNTER — Ambulatory Visit: Payer: BC Managed Care – PPO

## 2021-08-22 NOTE — Progress Notes (Signed)
Office Visit Note  Patient: Jake Huff             Date of Birth: [birth year]           MRN: [id number]             PCP: Charolette Forward, MD Referring: Charolette Forward, MD Visit Date: 09/05/2021 Occupation: @GUAROCC @  Subjective:  Recurrent uveitis and Achilles tendinitis   History of Present Illness: Jake Huff is a 45 y.o. male with history of ankylosing spondylitis.  He states he has been having more frequent episodes of uveitis had this year.  He has been followed by Dr. Baird Cancer.  He states in between for the minor episodes he goes to local optometrist.  He believes he had 6 episodes of uveitis this year.  He states he also had episodes of plantar fasciitis and Achilles tendinitis with each flare.  According to the patient the last flare was in October..  He does not have any symptoms of uveitis today.  He has intermittent discomfort in the SI joints.  None of the other joints are painful.  Activities of Daily Living:  Patient reports morning stiffness for 2-3 hours.   Patient Denies nocturnal pain.  Difficulty dressing/grooming: Reports Difficulty climbing stairs: Denies Difficulty getting out of chair: Denies Difficulty using hands for taps, buttons, cutlery, and/or writing: Denies  Review of Systems  Constitutional:  Positive for fatigue.  HENT:  Negative for mouth sores, mouth dryness and nose dryness.   Eyes:  Negative for pain, itching and dryness.  Respiratory:  Negative for shortness of breath and difficulty breathing.   Cardiovascular:  Negative for chest pain and palpitations.  Gastrointestinal:  Negative for blood in stool, constipation and diarrhea.  Endocrine: Negative for increased urination.  Genitourinary:  Negative for difficulty urinating.  Musculoskeletal:  Positive for morning stiffness. Negative for joint pain, joint pain, joint swelling, myalgias, muscle tenderness and myalgias.  Skin:  Positive for color change. Negative for rash, redness and  sensitivity to sunlight.  Allergic/Immunologic: Negative for susceptible to infections.  Neurological:  Positive for dizziness and memory loss. Negative for numbness and headaches.  Hematological:  Negative for bruising/bleeding tendency.  Psychiatric/Behavioral:  Negative for depressed mood, confusion and sleep disturbance. The patient is not nervous/anxious.    PMFS History:  Patient Active Problem List   Diagnosis Date Noted   NSVT (nonsustained ventricular tachycardia) 02/27/2021   Sinus tachycardia 02/27/2021   ICD (implantable cardioverter-defibrillator) in place 02/27/2021   GERD (gastroesophageal reflux disease) 07/05/2020   Hypertrophic cardiomyopathy ([place]) 01/10/2020   Palpitations 01/10/2020   HYPERCHOLESTEROLEMIA 11/30/2009   Primary cardiomyopathy ([place]) 11/30/2009   CHEST PAIN, ATYPICAL, HX OF 11/30/2009    Past Medical History:  Diagnosis Date   Hypertension    Hypertrophic cardiomyopathy ([facility])    Sinus tachycardia ? autonomic    SVT (supraventricular tachycardia) (HCC) s/p AV nodal modification     Family History  Problem Relation Age of Onset   Heart disease Father    Ankylosing spondylitis Father    Ankylosing spondylitis Sister    Heart disease Sister    Heart disease Brother    Healthy Daughter    Past Surgical History:  Procedure Laterality Date   FINGER SURGERY Left    ring finger   SUBQ ICD IMPLANT N/A 09/18/2018   Procedure: SUBQ ICD IMPLANT;  Surgeon: Deboraha Sprang, MD;  Location: [place] [facility];  Service: Cardiovascular;  Laterality: N/A;   Social History   Social History  Narrative   Not on file    There is no immunization history on file for this patient.   Objective: Vital Signs: BP 129/86 (BP Location: Right Arm, Patient Position: Sitting, Cuff Size: Large)   Pulse 78   Ht 6' 1"  (1.854 m)   Wt 216 lb 3.2 oz (98.1 kg)   BMI 28.52 kg/m    Physical Exam Vitals and nursing note reviewed.  Constitutional:      Appearance: He is  well-developed.  HENT:     Head: Normocephalic and atraumatic.  Eyes:     Conjunctiva/sclera: Conjunctivae normal.     Pupils: Pupils are equal, round, and reactive to light.  Cardiovascular:     Rate and Rhythm: Normal rate and regular rhythm.     Heart sounds: Normal heart sounds.  Pulmonary:     Effort: Pulmonary effort is normal.     Breath sounds: Normal breath sounds.  Abdominal:     General: Bowel sounds are normal.     Palpations: Abdomen is soft.  Musculoskeletal:     Cervical back: Normal range of motion and neck supple.  Skin:    General: Skin is warm and dry.     Capillary Refill: Capillary refill takes less than 2 seconds.  Neurological:     Mental Status: He is alert and oriented to person, place, and time.  Psychiatric:        Behavior: Behavior normal.     Musculoskeletal Exam: C-spine was in good range of motion.  He had limited range of motion of first thoracic and lumbar spine.  There was mild tenderness over SI joints.  Shoulder joints, elbow joints, wrist joints, MCPs PIPs and DIPs with good range of motion with no synovitis.  Hip joints, knee joints, ankles, MTPs with good range of motion without any tenderness.  There was no Achilles tendinitis or plantar fasciitis.  CDAI Exam: CDAI Score: -- Patient Global: --; Provider Global: -- Swollen: --; Tender: -- Joint Exam 09/05/2021   No joint exam has been documented for this visit   There is currently no information documented on the homunculus. Go to the Rheumatology activity and complete the homunculus joint exam.  Investigation: No additional findings.  Imaging: CUP PACEART REMOTE DEVICE CHECK  Result Date: 09/05/2021 Scheduled remote reviewed. Normal device function.  Next remote 91 days. LRSensing Configuration: Secondary Gain Setting: 1X Post Shock Pacing: OFF  XR Thoracic Spine 2 View  Result Date: 08/09/2021 No significant disc space narrowing was noted.  Anterior osteophytes were noted.   No syndesmophytes were noted. Impression: These findings are consistent with mild spondylosis of the thoracic spine.  XR Cervical Spine 2 or 3 views  Result Date: 08/09/2021 No disc space narrowing was noted.  No facet joint arthropathy was noted.  No syndesmophytes were noted. Impression: Unremarkable x-ray of the cervical spine.  XR Foot 2 Views Left  Result Date: 08/09/2021 First MTP narrowing was noted.  PIP and DIP narrowing was noted.  No intertarsal, tibiotalar or subtalar joint space narrowing was noted.  Posterior calcaneal spur was noted. Impression: These findings are consistent with early osteoarthritis of the foot.  XR Foot 2 Views Right  Result Date: 08/09/2021 First MTP narrowing was noted.  Mild PIP and DIP narrowing was noted.  No intertarsal, tibiotalar or subtalar joint space narrowing was noted.  Posterior calcaneal spur was noted. Impression: These findings are consistent with early osteoarthritis of the foot.  XR Hand 2 View Left  Result Date: 08/09/2021  CMC and PIP narrowing was noted.  Severe narrowing of right fourth PIP was noted.  No DIP, MCP, intercarpal or radiocarpal joint space narrowing was noted.  No erosive changes were noted. Impression: These findings are consistent with osteoarthritis of the hand.  Posttraumatic changes were noted in the right fourth PIP joint.  XR Hand 2 View Right  Result Date: 08/09/2021 Mild CMC and PIP narrowing was noted.  No DIP, MCP, intercarpal or radiocarpal joint space narrowing was noted.  No erosive changes were noted. Impression: These findings are consistent with early osteoarthritis of the hand.  XR Lumbar Spine 2-3 Views  Result Date: 08/09/2021 No disc space narrowing was noted.  Facet joint arthropathy was noted.  No syndesmophytes were noted. Impression: These findings are consistent with facet joint arthropathy of the lumbar spine.  XR Pelvis 1-2 Views  Result Date: 08/09/2021 No SI joint narrowing or  erosive changes were noted.  SI joint sclerosis was noted. Impression: These findings are consistent with SI joint sclerosis.   Recent Labs: Lab Results  Component Value Date   WBC 7.6 08/09/2021   HGB 15.5 08/09/2021   PLT 331 08/09/2021   NA 142 08/09/2021   K 4.2 08/09/2021   CL 103 08/09/2021   CO2 26 08/09/2021   GLUCOSE 83 08/09/2021   BUN 10 08/09/2021   CREATININE 0.86 08/09/2021   BILITOT 0.8 08/09/2021   ALKPHOS 64 12/06/2011   AST 29 08/09/2021   ALT 42 08/09/2021   PROT 7.2 08/09/2021   PROT 7.4 08/09/2021   ALBUMIN 4.8 12/06/2011   CALCIUM 9.4 08/09/2021   GFRAA >60 04/14/2019   QFTBGOLDPLUS NEGATIVE 08/09/2021   August 17, 2021 SPEP normal, immunoglobulins IgA mildly elevated IgM low, TB Gold negative, hepatitis B negative, hepatitis C negative, HIV negative, RF negative, anti-CCP negative, uric acid 8.8, ESR 2    Speciality Comments: No specialty comments available.  Procedures:  No procedures performed Allergies: Penicillins   Assessment / Plan:     Visit Diagnoses: Ankylosing spondylitis of multiple sites in spine (HCC) - HLA-B27 positive.  Dxd at [facility] at age 89.  History of uveitis, sacroiliitis, Achilles tendinitis, plantar fasciitis inflammatory arthritis.  He has been experiencing more frequent flares of uveitis, plantar fasciitis and Achilles tendinitis.  The last episode was in October.  He has not seen Dr. Baird Cancer since then.  We had detailed discussion regarding Humira and its side effects.  He was given a handout at the last visit as well.  After reviewing indications side effects contraindications he decided not to start the medication.  He believes his symptoms are not severe enough to take the risk of starting Humira.  I advised him to discuss this further with Dr. Baird Cancer.  I also discussed that methotrexate can help his uveitis symptoms but will not be helpful with sacroiliitis and Achilles tendinitis symptoms.  Uveitis - History of recurrent  uveitis since age 56.  He is followed by Dr. Tomi Likens at [facility] and Dr. Baird Cancer locally.  Last uveitis flare was in October 2022.  Patient will schedule an appointment with Dr. Baird Cancer in we will make a decision after that.  High risk medication use - According to the patient he declined methotrexate Enbrel and Humira in the past.  Chronic SI joint pain - Chronic SI joint pain for many years.  X-ray showed bilateral SI joint sclerosis.  No SI joint narrowing or erosive changes were noted.  X-ray findings were discussed with the patient.  Chronic midline  low back pain without sciatica -  facet joint arthropathy noted on the x-rays.  X-ray findings were discussed with the patient.  He does have decreased mobility in the lower back.  Stretching exercises were discussed.  Pain in thoracic spine - Mild degenerative changes were noted on the x-rays.[place] findings were discussed with the patient.  Neck pain - X-rays were unremarkable.  Primary osteoarthritis of both hands - Clinical and radiographic findings were consistent with osteoarthritis.  Primary osteoarthritis of both feet - Clinical and radiographic findings were consistent with osteoarthritis.  Patient gives history of Achilles tendinitis and plantar fasciitis.  Hyperuricemia-his uric acid was 8.8. He has not had any episodes of gout.  Essential hypertension-blood pressure was normal today.  Hypertrophic cardiomyopathy ([place]) - Diagnosed at age 58.  History of cardiomyopathy in his siblings.  His brother passed away from cardiomyopathy.  Chronic atrial fibrillation (HCC) - Followed by Dr. Adam Phenix  NSVT (nonsustained ventricular tachycardia)  ICD (implantable cardioverter-defibrillator) in place  History of hyperlipidemia  History of gastroesophageal reflux (GERD)  Former smoker - Half a pack per day for 10 years.  He quit smoking in 2009.  Family history of cardiomyopathy - Father, sister and brother  Family history of  ankylosing spondylitis - In his father, sister and brother per patient  Orders: No orders of the defined types were placed in this encounter.  No orders of the defined types were placed in this encounter.   Face-to-face time spent with patient was 30 minutes. Greater than 50% of time was spent in counseling and coordination of care.  Follow-Up Instructions: Return in about 4 months (around 01/03/2022) for Ankylosing spondylitis.   Bo Merino, MD  Note - This record has been created using Editor, commissioning.  Chart creation errors have been sought, but may not always  have been located. Such creation errors do not reflect on  the standard of medical care.

## 2021-09-05 ENCOUNTER — Ambulatory Visit: Payer: 59 | Admitting: Rheumatology

## 2021-09-05 ENCOUNTER — Encounter: Payer: Self-pay | Admitting: Rheumatology

## 2021-09-05 ENCOUNTER — Other Ambulatory Visit: Payer: Self-pay

## 2021-09-05 VITALS — BP 129/86 | HR 78 | Ht 73.0 in | Wt 216.2 lb

## 2021-09-05 DIAGNOSIS — M19071 Primary osteoarthritis, right ankle and foot: Secondary | ICD-10-CM | POA: Diagnosis not present

## 2021-09-05 DIAGNOSIS — Z8719 Personal history of other diseases of the digestive system: Secondary | ICD-10-CM

## 2021-09-05 DIAGNOSIS — M542 Cervicalgia: Secondary | ICD-10-CM | POA: Diagnosis not present

## 2021-09-05 DIAGNOSIS — M546 Pain in thoracic spine: Secondary | ICD-10-CM | POA: Diagnosis not present

## 2021-09-05 DIAGNOSIS — Z8639 Personal history of other endocrine, nutritional and metabolic disease: Secondary | ICD-10-CM

## 2021-09-05 DIAGNOSIS — I10 Essential (primary) hypertension: Secondary | ICD-10-CM

## 2021-09-05 DIAGNOSIS — Z79899 Other long term (current) drug therapy: Secondary | ICD-10-CM

## 2021-09-05 DIAGNOSIS — M19072 Primary osteoarthritis, left ankle and foot: Secondary | ICD-10-CM

## 2021-09-05 DIAGNOSIS — H209 Unspecified iridocyclitis: Secondary | ICD-10-CM | POA: Diagnosis not present

## 2021-09-05 DIAGNOSIS — M450 Ankylosing spondylitis of multiple sites in spine: Secondary | ICD-10-CM

## 2021-09-05 DIAGNOSIS — E790 Hyperuricemia without signs of inflammatory arthritis and tophaceous disease: Secondary | ICD-10-CM

## 2021-09-05 DIAGNOSIS — M545 Low back pain, unspecified: Secondary | ICD-10-CM | POA: Diagnosis not present

## 2021-09-05 DIAGNOSIS — M19041 Primary osteoarthritis, right hand: Secondary | ICD-10-CM | POA: Diagnosis not present

## 2021-09-05 DIAGNOSIS — Z8249 Family history of ischemic heart disease and other diseases of the circulatory system: Secondary | ICD-10-CM

## 2021-09-05 DIAGNOSIS — I422 Other hypertrophic cardiomyopathy: Secondary | ICD-10-CM

## 2021-09-05 DIAGNOSIS — M533 Sacrococcygeal disorders, not elsewhere classified: Secondary | ICD-10-CM | POA: Diagnosis not present

## 2021-09-05 DIAGNOSIS — Z95810 Presence of automatic (implantable) cardiac defibrillator: Secondary | ICD-10-CM

## 2021-09-05 DIAGNOSIS — I482 Chronic atrial fibrillation, unspecified: Secondary | ICD-10-CM

## 2021-09-05 DIAGNOSIS — I4729 Other ventricular tachycardia: Secondary | ICD-10-CM

## 2021-09-05 DIAGNOSIS — M19042 Primary osteoarthritis, left hand: Secondary | ICD-10-CM

## 2021-09-05 DIAGNOSIS — Z8269 Family history of other diseases of the musculoskeletal system and connective tissue: Secondary | ICD-10-CM

## 2021-09-05 DIAGNOSIS — G8929 Other chronic pain: Secondary | ICD-10-CM

## 2021-09-05 DIAGNOSIS — Z87891 Personal history of nicotine dependence: Secondary | ICD-10-CM

## 2021-09-05 LAB — CUP PACEART REMOTE DEVICE CHECK
Battery Remaining Percentage: 68 %
Date Time Interrogation Session: 20221028001000
Implantable Lead Implant Date: 20191129
Implantable Lead Location: 753862
Implantable Lead Model: 3501
Implantable Lead Serial Number: 163240
Implantable Pulse Generator Implant Date: 20191129
Pulse Gen Serial Number: 251605

## 2021-09-25 ENCOUNTER — Encounter: Payer: Self-pay | Admitting: Rheumatology

## 2021-09-25 NOTE — Telephone Encounter (Signed)
Would you like the patient to schedule a sooner office visit to further evaluate/discuss treatment options?

## 2021-09-25 NOTE — Telephone Encounter (Signed)
I returned patient's call.  He states is the first time he developed severe pain and swelling in his left wrist.  He believes it is a gout flare.  I discussed possible use of colchicine and allopurinol.  I have sent a message to Dr. Graciela Husbands to see if there will be any contraindications to use colchicine and allopurinol with his history of cardiac arrhythmias and the medications he is taking for cardiac arrhythmia.  Once I get Dr. Odessa Fleming approval that I can start patient on colchicine.

## 2021-09-26 NOTE — Telephone Encounter (Signed)
I sent the following message the patient- Thank you for forwarding the information from your pharmacist.  We should be able to start you on a low-dose colchicine and start allopurinol with gradually increasing dose.  You will need an appointment to start on these medications.  Please schedule for next available appointment with Ladona Ridgel or me.

## 2021-09-26 NOTE — Telephone Encounter (Signed)
Patient scheduled for 10/03/2021 at 9:20 am.

## 2021-10-02 NOTE — Progress Notes (Signed)
Office Visit Note  Patient: Jake Huff             Date of Birth: 1975/12/17           MRN: [id number]             PCP: Charolette Forward, MD Referring: Charolette Forward, MD Visit Date: 10/03/2021 Occupation: @GUAROCC @  Subjective:  Discussed medication options  History of Present Illness: Jake Huff is a 45 y.o. male with history of ankylosing spondylitis, uveitis, and osteoarthritis.  He declined Enbrel, Humira, and methotrexate in the past. He woke up this morning with increased lower back pain and stiffness. Denies any SI joint discomfort.  He denies any achilles tendonitis or plantar fasciitis.  He denies any signs or symptoms of a uveitis flare since his last visit.  He is not currently using durezol drops.  He presents today with ongoing pain and swelling in the left wrist joint which started at the end of November.  He sent a mychart message on 09/25/21 describing his symptoms in detail.  He has been concerned about the diagnosis of gout.  In the past he has had similar flares involving his great toe and foot.  He tried taking indomethacin for a couple of days which provided temporary relief. He also tried ibuprofen with minimal improvement in his symptoms.  His last dose of NSAIDs was 1 week ago. He denies any identifiable trigger for the flare but states it may have been beer since he normally does not drink beer.   Activities of Daily Living:  Patient reports morning stiffness for several hours.   Patient Reports nocturnal pain.  Difficulty dressing/grooming: Reports Difficulty climbing stairs: Reports Difficulty getting out of chair: Reports Difficulty using hands for taps, buttons, cutlery, and/or writing: Reports  Review of Systems  Constitutional:  Negative for fatigue.  HENT:  Negative for mouth sores, mouth dryness and nose dryness.   Eyes:  Negative for pain, itching and dryness.  Respiratory:  Negative for shortness of breath and difficulty breathing.    Cardiovascular:  Negative for chest pain and palpitations.  Gastrointestinal:  Negative for blood in stool, constipation and diarrhea.  Endocrine: Negative for increased urination.  Genitourinary:  Negative for difficulty urinating.  Musculoskeletal:  Positive for joint pain, joint pain, joint swelling, morning stiffness and muscle tenderness. Negative for myalgias and myalgias.  Skin:  Negative for color change, rash and redness.  Allergic/Immunologic: Negative for susceptible to infections.  Neurological:  Negative for dizziness, numbness, headaches, memory loss and weakness.  Hematological:  Negative for bruising/bleeding tendency.  Psychiatric/Behavioral:  Negative for confusion.    PMFS History:  Patient Active Problem List   Diagnosis Date Noted   NSVT (nonsustained ventricular tachycardia) 02/27/2021   Sinus tachycardia 02/27/2021   ICD (implantable cardioverter-defibrillator) in place 02/27/2021   GERD (gastroesophageal reflux disease) 07/05/2020   Hypertrophic cardiomyopathy ([place]) 01/10/2020   Palpitations 01/10/2020   HYPERCHOLESTEROLEMIA 11/30/2009   Primary cardiomyopathy ([place]) 11/30/2009   CHEST PAIN, ATYPICAL, HX OF 11/30/2009    Past Medical History:  Diagnosis Date   Hypertension    Hypertrophic cardiomyopathy ([place])    Sinus tachycardia ? autonomic    SVT (supraventricular tachycardia) (HCC) s/p AV nodal modification     Family History  Problem Relation Age of Onset   Heart disease Father    Ankylosing spondylitis Father    Ankylosing spondylitis Sister    Heart disease Sister    Heart disease Brother    Healthy Daughter  Past Surgical History:  Procedure Laterality Date   FINGER SURGERY Left    ring finger   SUBQ ICD IMPLANT N/A 09/18/2018   Procedure: SUBQ ICD IMPLANT;  Surgeon: Deboraha Sprang, MD;  Location: [facility];  Service: Cardiovascular;  Laterality: N/A;   Social History   Social History Narrative   Not on file    There is  no immunization history on file for this patient.   Objective: Vital Signs: BP 124/82 (BP Location: Right Arm, Patient Position: Sitting, Cuff Size: Normal)    Pulse 77    Ht 6' 1"  (1.854 m)    Wt 217 lb 3.2 oz (98.5 kg)    BMI 28.66 kg/m    Physical Exam Vitals and nursing note reviewed.  Constitutional:      Appearance: He is well-developed.  HENT:     Head: Normocephalic and atraumatic.  Eyes:     Conjunctiva/sclera: Conjunctivae normal.     Pupils: Pupils are equal, round, and reactive to light.  Cardiovascular:     Rate and Rhythm: Normal rate and regular rhythm.     Heart sounds: Normal heart sounds.  Pulmonary:     Effort: Pulmonary effort is normal.     Breath sounds: Normal breath sounds.  Abdominal:     General: Bowel sounds are normal.     Palpations: Abdomen is soft.  Musculoskeletal:     Cervical back: Normal range of motion and neck supple.  Skin:    General: Skin is warm and dry.     Capillary Refill: Capillary refill takes less than 2 seconds.  Neurological:     Mental Status: He is alert and oriented to person, place, and time.  Psychiatric:        Behavior: Behavior normal.     Musculoskeletal Exam: C-spine has good ROM with no discomfort.  Limited range of motion of the thoracic and lumbar spine.  No SI joint tenderness.  Midline spinal tenderness in the lumbar region.  Shoulder joints and elbow joints have good ROM with no tenderness.  Tenderness and synovitis of the left wrist.  Painful and limited extension of the left wrist.  Right wrist joint has good ROM with no tenderness or joint swelling.  No tenderness or synovitis of MCP joints.  Complete fist formation bilaterally.  Hip joints have good ROM with no discomfort.  Knee joints have good ROM with no warmth or effusion.  Ankle joints have good ROM with no tenderness or joint swelling. No evidence of achilles tendonitis or plantar fasciitis.   CDAI Exam: CDAI Score: -- Patient Global: --; Provider  Global: -- Swollen: 1 ; Tender: 2  Joint Exam 10/03/2021      Right  Left  Wrist     Swollen Tender  Lumbar Spine   Tender        Investigation: No additional findings.  Imaging: No results found.  Recent Labs: Lab Results  Component Value Date   WBC 7.6 08/09/2021   HGB 15.5 08/09/2021   PLT 331 08/09/2021   NA 142 08/09/2021   K 4.2 08/09/2021   CL 103 08/09/2021   CO2 26 08/09/2021   GLUCOSE 83 08/09/2021   BUN 10 08/09/2021   CREATININE 0.86 08/09/2021   BILITOT 0.8 08/09/2021   ALKPHOS 64 12/06/2011   AST 29 08/09/2021   ALT 42 08/09/2021   PROT 7.2 08/09/2021   PROT 7.4 08/09/2021   ALBUMIN 4.8 12/06/2011   CALCIUM 9.4 08/09/2021  GFRAA >60 04/14/2019   QFTBGOLDPLUS NEGATIVE 08/09/2021    Speciality Comments: No specialty comments available.  Procedures:  No procedures performed Allergies: Penicillins   Assessment / Plan:     Visit Diagnoses: Ankylosing spondylitis of multiple sites in spine (HCC) - HLA-B27 positive.  Dxd at [facility] at age 16.  History of uveitis, sacroiliitis, Achilles tendinitis, plantar fasciitis inflammatory arthritis: He presents today with increased pain and stiffness in his lumbar spine which started this morning.  He has painful limited range of motion of the thoracic and lumbar spine on examination today.  He has some midline spinal tenderness in the lumbar region.  No SI joint tenderness.  No evidence of achilles tendonitis or plantar fasciitis.  His morning stiffness has been lasting several hours he has been experiencing intermittent nocturnal pain.  His last uveitis flare was in October 2022.  He has not experiencing any eye pain, photophobia, or conjunctival injection at this time.  He is not currently taking any immunosuppressive agents.  He declined methotrexate, Enbrel, and Humira in the past.  He declined therapy at this time.  He plans on following up with Dr. Baird Cancer to further discuss his opinion about systemic treatment for  chronic uveitis.  Uveitis - History of recurrent uveitis since age 30.  He is followed by Dr. Tomi Likens at [facility] and Dr. Baird Cancer locally. Last uveitis flare was in October 2022.  He is not currently using durezol drops.  He is not experiencing any eye pain, conjunctival injection, or photophobia at this time.  He declined systemic therapy at this time.  He plans on following up with Dr. Baird Cancer to further discuss if he continues to have recurrent flares.  High risk medication use - Declined Methotrexate, enbrel, and humira in the past.   Chronic SI joint pain - X-ray showed bilateral SI joint sclerosis.  No SI joint narrowing or erosive changes were noted.   He has no SI joint tenderness on examination today.   Chronic midline low back pain without sciatica - Facet joint arthropathy noted on the x-rays. Limited ROM of the lumbar spine.  Midline spinal tenderness in the lumbar region.  This morning stiffness has been lasting several hours and he experiences intermittent nocturnal pain.  He has limited range of motion of the lumbar spine on examination today.  Pain in thoracic spine - Mild degenerative changes were noted on the x-rays.  No midline spinal tenderness in the thoracic region.   Neck pain - X-rays were unremarkable. He has good ROM of the C-spine with no discomfort.  No symptoms of radiculopathy.   Primary osteoarthritis of both hands - Clinical and radiographic findings were consistent with osteoarthritis.  He has PIP and DIP thickening consistent with osteoarthritis of both hands.   Primary osteoarthritis of both feet - Clinical and radiographic findings were consistent with osteoarthritis. He is not experiencing any discomfort in his feet at this time.  He is wearing proper fitting shoes.   Idiopathic chronic gout of multiple sites without tophus: Discussed the diagnosis and pathophysiology of gout in detail today.  He has a history of known hyperuricemia and has had several flares  consistent with gout in the past.  He is currently having a gout flare in the left wrist.  He has tenderness, erythema, and swelling in the left wrist on examination today.  This flare started at the end of November and was likely triggered by drinking beer.  He rarely drinks beer.  He has occasional red  meat but has not had shellfish recently.  He tries to avoid processed foods.  Discussed the importance of avoiding a purine rich diet as well as beer and high fructose corn syrup.  He was given a handout of information to review. The plan is to initiate allopurinol and colchicine as discussed over MyChart between Dr. Estanislado Pandy and the patient.  He will initiate colchicine 0.6 mg half tablet daily.  Discussed the possible interaction between verapamil and colchicine.  He will remain on low-dose colchicine and monitor for signs of toxicity.  Potential side effects were discussed today in detail.   He will then add allopurinol in 3 to 4 days.  Indications, contraindications, potential side effects of allopurinol were discussed today in detail.  He will start on allopurinol 100 mg daily x1 week then increase to 200 mg daily for 1 week then further increase to 300 mg daily. He will return to update lab work including CBC, CMP, and uric acid in 6 weeks.  Future orders will be placed today. He will follow up in 8 weeks.  Hyperuricemia: Uric acid was 8.8 on 08/09/21. Uric acid will be rechecked in 6 weeks.  Future order placed today.  Other medical conditions are listed as follows:   Essential hypertension: BP was 124/82 today in the office.   Chronic atrial fibrillation (HCC) - Followed by Dr. Adam Phenix  Hypertrophic cardiomyopathy [facility]) - Diagnosed at age 82.  History of cardiomyopathy in his siblings.  His brother passed away from cardiomyopathy.  History of hyperlipidemia  NSVT (nonsustained ventricular tachycardia)  ICD (implantable cardioverter-defibrillator) in place  History of  gastroesophageal reflux (GERD)  Family history of cardiomyopathy - Father, sister and brother  Family history of ankylosing spondylitis - In his father, sister and brother per patient  Former smoker - Half a pack per day for 10 years.  He quit smoking in 2009.  Orders: Orders Placed This Encounter  Procedures   CBC with Differential/Platelet   COMPLETE METABOLIC PANEL WITH GFR   Uric acid    Meds ordered this encounter  Medications   colchicine 0.6 MG tablet    Sig: Take 0.5 tablets (0.3 mg total) by mouth daily.    Dispense:  45 tablet    Refill:  0   allopurinol (ZYLOPRIM) 100 MG tablet    Sig: Take 100 mg once daily x 7 days, then 200 mg once daily x 7 days, then 300 mg once daily thereafter    Dispense:  69 tablet    Refill:  0       Follow-Up Instructions: Return in 8 weeks (on 11/28/2021) for Ankylosing Spondylitis, Uveitis, Osteoarthritis.   Ofilia Neas, PA-C  Note - This record has been created using Dragon software.  Chart creation errors have been sought, but may not always  have been located. Such creation errors do not reflect on  the standard of medical care.

## 2021-10-03 ENCOUNTER — Encounter: Payer: Self-pay | Admitting: Physician Assistant

## 2021-10-03 ENCOUNTER — Ambulatory Visit (INDEPENDENT_AMBULATORY_CARE_PROVIDER_SITE_OTHER): Payer: 59 | Admitting: Physician Assistant

## 2021-10-03 ENCOUNTER — Other Ambulatory Visit: Payer: Self-pay

## 2021-10-03 VITALS — BP 124/82 | HR 77 | Ht 73.0 in | Wt 217.2 lb

## 2021-10-03 DIAGNOSIS — M450 Ankylosing spondylitis of multiple sites in spine: Secondary | ICD-10-CM

## 2021-10-03 DIAGNOSIS — M19041 Primary osteoarthritis, right hand: Secondary | ICD-10-CM | POA: Diagnosis not present

## 2021-10-03 DIAGNOSIS — Z95810 Presence of automatic (implantable) cardiac defibrillator: Secondary | ICD-10-CM

## 2021-10-03 DIAGNOSIS — E790 Hyperuricemia without signs of inflammatory arthritis and tophaceous disease: Secondary | ICD-10-CM

## 2021-10-03 DIAGNOSIS — M19071 Primary osteoarthritis, right ankle and foot: Secondary | ICD-10-CM | POA: Diagnosis not present

## 2021-10-03 DIAGNOSIS — Z79899 Other long term (current) drug therapy: Secondary | ICD-10-CM

## 2021-10-03 DIAGNOSIS — I482 Chronic atrial fibrillation, unspecified: Secondary | ICD-10-CM

## 2021-10-03 DIAGNOSIS — M542 Cervicalgia: Secondary | ICD-10-CM

## 2021-10-03 DIAGNOSIS — M1A09X0 Idiopathic chronic gout, multiple sites, without tophus (tophi): Secondary | ICD-10-CM | POA: Diagnosis not present

## 2021-10-03 DIAGNOSIS — M533 Sacrococcygeal disorders, not elsewhere classified: Secondary | ICD-10-CM | POA: Diagnosis not present

## 2021-10-03 DIAGNOSIS — M546 Pain in thoracic spine: Secondary | ICD-10-CM | POA: Diagnosis not present

## 2021-10-03 DIAGNOSIS — Z8249 Family history of ischemic heart disease and other diseases of the circulatory system: Secondary | ICD-10-CM

## 2021-10-03 DIAGNOSIS — Z8719 Personal history of other diseases of the digestive system: Secondary | ICD-10-CM

## 2021-10-03 DIAGNOSIS — I4729 Other ventricular tachycardia: Secondary | ICD-10-CM

## 2021-10-03 DIAGNOSIS — M19042 Primary osteoarthritis, left hand: Secondary | ICD-10-CM

## 2021-10-03 DIAGNOSIS — G8929 Other chronic pain: Secondary | ICD-10-CM

## 2021-10-03 DIAGNOSIS — I422 Other hypertrophic cardiomyopathy: Secondary | ICD-10-CM

## 2021-10-03 DIAGNOSIS — M545 Low back pain, unspecified: Secondary | ICD-10-CM | POA: Diagnosis not present

## 2021-10-03 DIAGNOSIS — Z87891 Personal history of nicotine dependence: Secondary | ICD-10-CM

## 2021-10-03 DIAGNOSIS — H209 Unspecified iridocyclitis: Secondary | ICD-10-CM | POA: Diagnosis not present

## 2021-10-03 DIAGNOSIS — I10 Essential (primary) hypertension: Secondary | ICD-10-CM

## 2021-10-03 DIAGNOSIS — Z8639 Personal history of other endocrine, nutritional and metabolic disease: Secondary | ICD-10-CM

## 2021-10-03 DIAGNOSIS — M19072 Primary osteoarthritis, left ankle and foot: Secondary | ICD-10-CM

## 2021-10-03 DIAGNOSIS — Z8269 Family history of other diseases of the musculoskeletal system and connective tissue: Secondary | ICD-10-CM

## 2021-10-03 MED ORDER — COLCHICINE 0.6 MG PO TABS: 0.3000 mg | ORAL_TABLET | Freq: Every day | ORAL | 0 refills | Status: DC

## 2021-10-03 MED ORDER — ALLOPURINOL 100 MG PO TABS: ORAL_TABLET | ORAL | 0 refills | Status: DC

## 2021-10-03 NOTE — Progress Notes (Signed)
Counseled patient on the purpose proper use, and adverse effects of colchicine and allopurinol  Reviewed importance of avoiding alcohol - he infrequently drinks.  Discussed possible side effects of nausea, diarrhea which are dose-related. Discussed possible for muscle weakness after long period of use.  Discussed importance of low purine diet - avoiding alcohol, shellfish, red meats, etc.  Provided patient with medication education material and answered all questions.    Reviewed drug-drug interaction with verapamil - can increase levels of colchicine (increased risk of toxicity and GI side effects). Interaction with atorvastatin MAY increase risk for myopathy.  Patient's dose will be: low-dose colchicine 0.3 mg once daily (due to interactions noted above, specifically with verapamil). He will start allopurinol 3-4 days after colchicine. Dose of allopurinol will be 100mg  daily x 7 days, 200mg  daily x 7 days, then increase to 300mg  daily thereafter. Rx sent to patient's local pharmacy today - all baseline labs in place  Will recheck CMP, CBC, and uric acid in 6 weeks (approximately 4 weeks after starting allopurinol 300mg  daily dose)  , PharmD, MPH, BCPS Clinical Pharmacist (Rheumatology and Pulmonology)

## 2021-10-03 NOTE — Patient Instructions (Addendum)
Start colchicine 0.3 mg (half tablet daily).  Wait for 3-4 days before starting allopurinol. Take 100mg  once daily for 1 week, then increase to 200mg  once daily for 1 week, then increase to 300mg  once daily.  Standing Labs We placed an order today for your standing lab work.   Please have your standing labs drawn in 6 weeks  If possible, please have your labs drawn 2 weeks prior to your appointment so that the provider can discuss your results at your appointment.  Please note that you may see your imaging and lab results in MyChart before we have reviewed them. We may be awaiting multiple results to interpret others before contacting you. Please allow our office up to 72 hours to thoroughly review all of the results before contacting the office for clarification of your results.  We have open lab daily: Monday through Thursday from 1:30-4:30 PM and Friday from 1:30-4:00 PM at the office of Dr. Friday, [facility].   Please be advised, all patients with office appointments requiring lab work will take precedent over walk-in lab work.  If possible, please come for your lab work on Monday and Friday afternoons, as you may experience shorter wait times. The office is located at [address], [address], [place], Monday [address] No appointment is necessary.   Labs are drawn by [facility]. Please bring your co-pay at the time of your lab draw.  You may receive a bill from [facility] for your lab work.  If you wish to have your labs drawn at another location, please call the office 24 hours in advance to send orders.  If you have any questions regarding directions or hours of operation,  please call [phone].   As a reminder, please drink plenty of water prior to coming for your lab work. Thanks!   Information for patients with Gout  Gout defined-Gout occurs when urate crystals accumulate in your joint causing the inflammation and intense pain of gout attack.  Urate  crystals can form when you have high levels of uric acid in your blood.  Your body produces uric acid when it breaks down prurines-substances that are found naturally in your body, as well as in certain foods such as organ meats, anchioves, herring, asparagus, and mushrooms.  Normally uric acid dissolves in your blood and passes through your kidneys into your urine.  But sometimes your body either produces too much uric acid or your kidneys excrete too little uric acid.  When this happens, uric acid can build up, forming sharp needle-like urate crystals in a joint or surrounding tissue that cause pain, inflammation and swelling.    Gout is characterized by sudden, severe attacks of pain, redness and tenderness in joints, often the joint at the base of the big toe.  Gout is complex form of arthritis that can affect anyone.  Men are more likely to get gout but women become increasingly more susceptible to gout after menopause.  An acute attack of gout can wake you up in the middle of the night with the sensation that your big toe is on fire.  The affected joint is hot, swollen and so tender that even the weight or the sheet on it may seem intolerable.  If you experience symptoms of an acute gout attack it is important to your doctor as soon as the symptoms start.  Gout that goes untreated can lead to worsening pain and joint damage.  Risk Factors:  You are more likely to develop  gout if you have high levels of uric acid in your body.    Factors that increase the uric acid level in your body include:  Lifestyle factors.  Excessive alcohol use-generally more than two drinks a day for men and more than one for women increase the risk of gout.  Medical conditions.  Certain conditions make it more likely that you will develop gout.  These include hypertension, and chronic conditions such as diabetes, high levels of fat and cholesterol in the blood, and narrowing of the arteries.  Certain medications.   The uses of Thiazide diuretics- commonly used to treat hypertension and low dose aspirin can also increase uric acid levels.  Family history of gout.  If other members of your family have had gout, you are more likely to develop the disease.  Age and sex. Gout occurs more often in men than it does in women, primarily because women tend to have lower uric acid levels than men do.  Men are more likely to develop gout earlier usually between the ages of 42-50- whereas women generally develop signs and symptoms after menopause.    Tests and diagnosis:  Tests to help diagnose gout may include:  Blood test.  Your doctor may recommend a blood test to measure the uric acid level in your blood .  Blood tests can be misleading, though.  Some people have high uric acid levels but never experience gout.  And some people have signs and symptoms of gout, but don't have unusual levels of uric acid in their blood.  Joint fluid test.  Your doctor may use a needle to draw fluid from your affected joint.  When examined under the microscope, your joint fluid may reveal urate crystals.  Treatment:  Treatment for gout usually involves medications.  What medications you and your doctor choose will be based on your current health and other medications you currently take.  Gout medications can be used to treat acute gout attacks and prevent future attacks as well as reduce your risk of complications from gout such as the development of tophi from urate crystal deposits.  Alternative medicine:   Certain foods have been studied for their potential to lower uric acid levels, including:  Coffee.  Studies have found an association between coffee drinking (regular and decaf) and lower uric acid levels.  The evidence is not enough to encourage non-coffee drinkers to start, but it may give clues to new ways of treating gout in the future.  Vitamin C.  Supplements containing vitamin C may reduce the levels of uric acid in  your blood.  However, vitamin as a treatment for gout. Don't assume that if a little vitamin C is good, than lots is better.  Megadoses of vitamin C may increase your bodies uric acid levels.  Cherries.  Cherries have been associated with lower levels of uric acid in studies, but it isn't clear if they have any effect on gout signs and symptoms.  Eating more cherries and other dar-colored fruits, such as blackberries, blueberries, purple grapes and raspberries, may be a safe way to support your gout treatment.    Lifestyle/Diet Recommendations:  Drink 8 to 16 cups ( about 2 to 4 liters) of fluid each day, with at least half being water. Avoid alcohol Eat a moderate amount of protein, preferably from healthy sources, such as low-fat or fat-free dairy, tofu, eggs, and nut butters. Limit you daily intake of meat, fish, and poultry to 4 to 6 ounces. Avoid high  fat meats and desserts. Decrease you intake of shellfish, beef, lamb, pork, eggs and cheese. Choose a good source of vitamin C daily such as citrus fruits, strawberries, broccoli,  brussel sprouts, papaya, and cantaloupe.  Choose a good source of vitamin A every other day such as yellow fruits, or dark green/yellow vegetables. Avoid drastic weigh reduction or fasting.  If weigh loss is desired lose it over a period of several months. See "dietary considerations.." chart for specific food recommendations.  Dietary Considerations for people with Gout  Food with negligible purine content (0-15 mg of purine nitrogen per 100 grams food)  May use as desired except on calorie variations  Non fat milk Cocoa Cereals (except in list II) Hard candies  Buttermilk Carbonated drinks Vegetables (except in list II) Sherbet  Coffee Fruits Sugar Honey  Tea Cottage Cheese Gelatin-jell-o Salt  Fruit juice Breads Angel food Cake   Herbs/spices Jams/Jellies [employer]    Foods that do not contain excessive purine content, but must be limited  due to fat content  Cream Eggs Oil and Salad Dressing  Half and Half Peanut Butter Chocolate  Whole Milk Cakes Potato Chips  Butter Ice Cream Fried Foods  Cheese Nuts Waffles, pancakes   List II: Food with moderate purine content (50-150 mg of purine nitrogen per 100 grams of food)  Limit total amount each day to 5 oz. cooked Lean meat, other than those on list III   Poultry, other than those on list III Fish, other than those on list III   Seafood, other than those on list III  These foods may be used occasionally  Peas Lentils Bran  Spinach Oatmeal Dried Beans and Peas  Asparagus Wheat Germ Mushrooms   Additional information about meat choices  Choose fish and poultry, particularly without skin, often.  Select lean, well trimmed cuts of meat.  Avoid all fatty meats, bacon , sausage, fried meats, fried fish, or poultry, luncheon meats, cold cuts, hot dogs, meats canned or frozen in gravy, spareribs and frozen and packaged prepared meats.   List III: Foods with HIGH purine content / Foods to AVOID (150-800 mg of purine nitrogen per 100 grams of food)  Anchovies Herring Meat Broths  Liver Mackerel Meat Extracts  Kidney Scallops Meat Drippings  Sardines Wild Game Mincemeat  Sweetbreads Goose Gravy  Heart Tongue Yeast, baker's and brewers   Commercial soups made with any of the foods listed in List II or List III  In addition avoid all alcoholic beverages

## 2021-10-25 ENCOUNTER — Other Ambulatory Visit: Payer: Self-pay | Admitting: Physician Assistant

## 2021-10-25 DIAGNOSIS — M1A09X0 Idiopathic chronic gout, multiple sites, without tophus (tophi): Secondary | ICD-10-CM

## 2021-10-25 DIAGNOSIS — E790 Hyperuricemia without signs of inflammatory arthritis and tophaceous disease: Secondary | ICD-10-CM

## 2021-10-25 NOTE — Telephone Encounter (Addendum)
Patient states he does not need a refill at this time. Patient states he started the medication about a week ago.

## 2021-11-15 ENCOUNTER — Ambulatory Visit (INDEPENDENT_AMBULATORY_CARE_PROVIDER_SITE_OTHER): Payer: 59

## 2021-11-15 ENCOUNTER — Telehealth: Payer: Self-pay

## 2021-11-15 ENCOUNTER — Telehealth: Payer: Self-pay | Admitting: Internal Medicine

## 2021-11-15 DIAGNOSIS — I422 Other hypertrophic cardiomyopathy: Secondary | ICD-10-CM

## 2021-11-15 NOTE — Telephone Encounter (Signed)
Remote transmission faxed as requested to [phone].

## 2021-11-15 NOTE — Telephone Encounter (Signed)
The patient returned my call. He states it will be after 4:30 pm when he can send the transmission. I told him if I do not see it by tomorrow then I will give him a call back first thing in the morning.

## 2021-11-15 NOTE — Telephone Encounter (Signed)
Levada Dy from [facility] wanting a copy of the patient last transmission. The patient has not transmitted since 08/2021.   LMOVM for patient to send a manual transmission.

## 2021-11-15 NOTE — Telephone Encounter (Signed)
° °  RN Marylene Land with [facility] called, she is requesting to get a copy of pt's recent transmission, last transmission received was on 09/04/21, she said that's fine and if she can get a copy fax to [phone]

## 2021-11-16 LAB — CUP PACEART REMOTE DEVICE CHECK
Battery Remaining Percentage: 65 %
Date Time Interrogation Session: 20230126162700
Implantable Lead Implant Date: 20191129
Implantable Lead Location: 753862
Implantable Lead Model: 3501
Implantable Lead Serial Number: 163240
Implantable Pulse Generator Implant Date: 20191129
Pulse Gen Serial Number: 251605

## 2021-11-20 NOTE — Telephone Encounter (Signed)
Transmission received 11/15/2021.

## 2021-11-21 ENCOUNTER — Other Ambulatory Visit: Payer: Self-pay | Admitting: Physician Assistant

## 2021-11-21 DIAGNOSIS — M1A09X0 Idiopathic chronic gout, multiple sites, without tophus (tophi): Secondary | ICD-10-CM

## 2021-11-21 DIAGNOSIS — E790 Hyperuricemia without signs of inflammatory arthritis and tophaceous disease: Secondary | ICD-10-CM

## 2021-11-22 ENCOUNTER — Other Ambulatory Visit: Payer: Self-pay | Admitting: *Deleted

## 2021-11-22 DIAGNOSIS — Z79899 Other long term (current) drug therapy: Secondary | ICD-10-CM | POA: Diagnosis not present

## 2021-11-22 DIAGNOSIS — M1A09X0 Idiopathic chronic gout, multiple sites, without tophus (tophi): Secondary | ICD-10-CM | POA: Diagnosis not present

## 2021-11-22 DIAGNOSIS — E790 Hyperuricemia without signs of inflammatory arthritis and tophaceous disease: Secondary | ICD-10-CM

## 2021-11-23 ENCOUNTER — Other Ambulatory Visit: Payer: Self-pay | Admitting: *Deleted

## 2021-11-23 DIAGNOSIS — M1A09X0 Idiopathic chronic gout, multiple sites, without tophus (tophi): Secondary | ICD-10-CM

## 2021-11-23 DIAGNOSIS — Z79899 Other long term (current) drug therapy: Secondary | ICD-10-CM

## 2021-11-23 LAB — CBC WITH DIFFERENTIAL/PLATELET
Absolute Monocytes: 608 cells/uL (ref 200–950)
Basophils Absolute: 49 cells/uL (ref 0–200)
Basophils Relative: 0.6 %
Eosinophils Absolute: 49 cells/uL (ref 15–500)
Eosinophils Relative: 0.6 %
HCT: 43.8 % (ref 38.5–50.0)
Hemoglobin: 14.8 g/dL (ref 13.2–17.1)
Lymphs Abs: 2292 cells/uL (ref 850–3900)
MCH: 31.6 pg (ref 27.0–33.0)
MCHC: 33.8 g/dL (ref 32.0–36.0)
MCV: 93.4 fL (ref 80.0–100.0)
MPV: 8.9 fL (ref 7.5–12.5)
Monocytes Relative: 7.5 %
Neutro Abs: 5103 cells/uL (ref 1500–7800)
Neutrophils Relative %: 63 %
Platelets: 301 10*3/uL (ref 140–400)
RBC: 4.69 10*6/uL (ref 4.20–5.80)
RDW: 12.6 % (ref 11.0–15.0)
Total Lymphocyte: 28.3 %
WBC: 8.1 10*3/uL (ref 3.8–10.8)

## 2021-11-23 LAB — COMPLETE METABOLIC PANEL WITH GFR
AG Ratio: 1.4 (calc) (ref 1.0–2.5)
ALT: 63 U/L — ABNORMAL HIGH (ref 9–46)
AST: 41 U/L — ABNORMAL HIGH (ref 10–40)
Albumin: 4.4 g/dL (ref 3.6–5.1)
Alkaline phosphatase (APISO): 54 U/L (ref 36–130)
BUN: 12 mg/dL (ref 7–25)
CO2: 28 mmol/L (ref 20–32)
Calcium: 9.3 mg/dL (ref 8.6–10.3)
Chloride: 104 mmol/L (ref 98–110)
Creat: 0.94 mg/dL (ref 0.60–1.29)
Globulin: 3.1 g/dL (calc) (ref 1.9–3.7)
Glucose, Bld: 98 mg/dL (ref 65–99)
Potassium: 4.1 mmol/L (ref 3.5–5.3)
Sodium: 141 mmol/L (ref 135–146)
Total Bilirubin: 0.9 mg/dL (ref 0.2–1.2)
Total Protein: 7.5 g/dL (ref 6.1–8.1)
eGFR: 101 mL/min/{1.73_m2} (ref >=60)

## 2021-11-23 LAB — URIC ACID: Uric Acid, Serum: 5.9 mg/dL (ref 4.0–8.0)

## 2021-11-23 MED ORDER — ALLOPURINOL 100 MG PO TABS: 200.0000 mg | ORAL_TABLET | Freq: Every day | ORAL | 2 refills | Status: DC

## 2021-11-23 NOTE — Progress Notes (Signed)
LFTs are elevated.  Rest of CMP WNL.  CBC WNL.  Uric acid has improved to 5.9 which is within the desirable range.   Please clarify if he has been taking any tylenol, NSAIDs, or alcohol use.  Please all clarify what dose of allopurinol he is taking.

## 2021-11-23 NOTE — Telephone Encounter (Signed)
-----   Message from Gearldine Bienenstock, PA-C sent at 11/23/2021 10:46 AM EST ----- Please advise the patient to reduce allopurinol to 200 mg daily.   Recheck hepatic function panel in 2 weeks.  Avoid tylenol, NSAID, and alcohol use.

## 2021-11-23 NOTE — Progress Notes (Signed)
Please advise the patient to reduce allopurinol to 200 mg daily.   Recheck hepatic function panel in 2 weeks.  Avoid tylenol, NSAID, and alcohol use.

## 2021-11-26 NOTE — Progress Notes (Signed)
Remote ICD transmission.   

## 2021-12-02 ENCOUNTER — Other Ambulatory Visit: Payer: Self-pay | Admitting: Physician Assistant

## 2021-12-02 DIAGNOSIS — E790 Hyperuricemia without signs of inflammatory arthritis and tophaceous disease: Secondary | ICD-10-CM

## 2021-12-02 DIAGNOSIS — M1A09X0 Idiopathic chronic gout, multiple sites, without tophus (tophi): Secondary | ICD-10-CM

## 2021-12-03 NOTE — Telephone Encounter (Signed)
Next Visit: 01/02/2022  Last Visit: 10/03/2021  Last Fill: 10/03/2021  DX: Idiopathic chronic gout of multiple sites without tophus  Current Dose per office note 10/03/2021: colchicine 0.6 mg half tablet daily  Labs: 11/22/2021 LFTs are elevated.  Rest of CMP WNL.  CBC WNL.  Uric acid has improved to 5.9 which is within the desirable range.     Okay to refill Colchicine?

## 2021-12-13 ENCOUNTER — Other Ambulatory Visit: Payer: Self-pay

## 2021-12-13 DIAGNOSIS — M1A09X0 Idiopathic chronic gout, multiple sites, without tophus (tophi): Secondary | ICD-10-CM | POA: Diagnosis not present

## 2021-12-13 DIAGNOSIS — Z79899 Other long term (current) drug therapy: Secondary | ICD-10-CM | POA: Diagnosis not present

## 2021-12-13 LAB — HEPATIC FUNCTION PANEL
AG Ratio: 1.5 (calc) (ref 1.0–2.5)
ALT: 44 U/L (ref 9–46)
AST: 29 U/L (ref 10–40)
Albumin: 4.4 g/dL (ref 3.6–5.1)
Alkaline phosphatase (APISO): 57 U/L (ref 36–130)
Bilirubin, Direct: 0.1 mg/dL (ref 0.0–0.2)
Globulin: 2.9 g/dL (calc) (ref 1.9–3.7)
Indirect Bilirubin: 0.6 mg/dL (calc) (ref 0.2–1.2)
Total Bilirubin: 0.7 mg/dL (ref 0.2–1.2)
Total Protein: 7.3 g/dL (ref 6.1–8.1)

## 2021-12-14 NOTE — Progress Notes (Signed)
Liver functions are normal now.

## 2021-12-20 NOTE — Progress Notes (Signed)
Office Visit Note  Patient: Jake Huff             Date of Birth: 12/27/1975           MRN: [id number]             PCP: Rinaldo Cloud, MD Referring: Rinaldo Cloud, MD Visit Date: 01/02/2022 Occupation: @GUAROCC @  Subjective:  Medication management  History of Present Illness: Jake Huff is a 46 y.o. male with history of ankylosing spondylitis, osteoarthritis and gout.  He states that he is doing much better on allopurinol.  He has been taking allopurinol 200 mg p.o. daily.  He uses colchicine only on as needed basis.  He had 1 episode of mild discomfort in his foot but no gout flare.  He denies any history of inflammatory arthritis, uveitis, Achilles tendinitis of Planter fasciitis.  He continues to have some stiffness in his back.  He is planning to join a gym to be more active.  Activities of Daily Living:  Patient reports morning stiffness for 0 minutes.   Patient Denies nocturnal pain.  Difficulty dressing/grooming: Denies Difficulty climbing stairs: Denies Difficulty getting out of chair: Denies Difficulty using hands for taps, buttons, cutlery, and/or writing: Denies  Review of Systems  Constitutional:  Positive for fatigue.  HENT:  Negative for mouth sores, mouth dryness and nose dryness.   Eyes:  Positive for itching. Negative for pain and dryness.  Respiratory:  Negative for shortness of breath and difficulty breathing.   Cardiovascular:  Negative for chest pain and palpitations.  Gastrointestinal:  Negative for blood in stool, constipation and diarrhea.  Endocrine: Negative for increased urination.  Genitourinary:  Negative for difficulty urinating.  Musculoskeletal:  Negative for joint pain, joint pain, joint swelling, myalgias, morning stiffness, muscle tenderness and myalgias.  Skin:  Negative for color change, rash and redness.  Allergic/Immunologic: Negative for susceptible to infections.  Neurological:  Negative for dizziness, numbness, headaches, memory  loss and weakness.  Hematological:  Negative for bruising/bleeding tendency.  Psychiatric/Behavioral:  Negative for confusion.    PMFS History:  Patient Active Problem List   Diagnosis Date Noted   NSVT (nonsustained ventricular tachycardia) 02/27/2021   Sinus tachycardia 02/27/2021   ICD (implantable cardioverter-defibrillator) in place 02/27/2021   GERD (gastroesophageal reflux disease) 07/05/2020   Hypertrophic cardiomyopathy (HCC) 01/10/2020   Palpitations 01/10/2020   HYPERCHOLESTEROLEMIA 11/30/2009   Primary cardiomyopathy (HCC) 11/30/2009   CHEST PAIN, ATYPICAL, HX OF 11/30/2009    Past Medical History:  Diagnosis Date   Hypertension    Hypertrophic cardiomyopathy (HCC)    Sinus tachycardia ? autonomic    SVT (supraventricular tachycardia) (HCC) s/p AV nodal modification     Family History  Problem Relation Age of Onset   Heart disease Father    Ankylosing spondylitis Father    Ankylosing spondylitis Sister    Heart disease Sister    Heart disease Brother    Healthy Daughter    Past Surgical History:  Procedure Laterality Date   FINGER SURGERY Left    ring finger   SUBQ ICD IMPLANT N/A 09/18/2018   Procedure: SUBQ ICD IMPLANT;  Surgeon: Duke Salvia, MD;  Location: [facility] INVASIVE CV LAB;  Service: Cardiovascular;  Laterality: N/A;   Social History   Social History Narrative   Not on file    There is no immunization history on file for this patient.   Objective: Vital Signs: BP 130/80 (BP Location: Right Arm, Patient Position: Sitting, Cuff Size: Normal)   Pulse  65   Ht 6\' 1"  (1.854 m)   Wt 214 lb 9.6 oz (97.3 kg)   BMI 28.31 kg/m    Physical Exam Vitals and nursing note reviewed.  Constitutional:      Appearance: He is well-developed.  HENT:     Head: Normocephalic and atraumatic.  Eyes:     Conjunctiva/sclera: Conjunctivae normal.     Pupils: Pupils are equal, round, and reactive to light.  Cardiovascular:     Rate and Rhythm: Normal rate and  regular rhythm.     Heart sounds: Normal heart sounds.  Pulmonary:     Effort: Pulmonary effort is normal.     Breath sounds: Normal breath sounds.  Abdominal:     General: Bowel sounds are normal.     Palpations: Abdomen is soft.  Musculoskeletal:     Cervical back: Normal range of motion and neck supple.  Skin:    General: Skin is warm and dry.     Capillary Refill: Capillary refill takes less than 2 seconds.  Neurological:     Mental Status: He is alert and oriented to person, place, and time.  Psychiatric:        Behavior: Behavior normal.     Musculoskeletal Exam: C-spine was in good range of motion.  He had some limitation with range of motion of his lumbar spine.  There was no tenderness over thoracic or lumbar spine.  There was no SI joint tenderness.  Shoulder joints, elbow joints, wrist joints, MCPs PIPs and DIPs and good range of motion except for the contracture in his left fourth PIP joint due to previous injury.  Hip joints and knee joints with good range of motion.  He had no tenderness over ankles or MTPs.  There was no evidence of Achilles tendinitis of Planter fasciitis.  CDAI Exam: CDAI Score: -- Patient Global: --; Provider Global: -- Swollen: --; Tender: -- Joint Exam 01/02/2022   No joint exam has been documented for this visit   There is currently no information documented on the homunculus. Go to the Rheumatology activity and complete the homunculus joint exam.  Investigation: No additional findings.  Imaging: No results found.  Recent Labs: Lab Results  Component Value Date   WBC 8.1 11/22/2021   HGB 14.8 11/22/2021   PLT 301 11/22/2021   NA 141 11/22/2021   K 4.1 11/22/2021   CL 104 11/22/2021   CO2 28 11/22/2021   GLUCOSE 98 11/22/2021   BUN 12 11/22/2021   CREATININE 0.94 11/22/2021   BILITOT 0.7 12/13/2021   ALKPHOS 64 12/06/2011   AST 29 12/13/2021   ALT 44 12/13/2021   PROT 7.3 12/13/2021   ALBUMIN 4.8 12/06/2011   CALCIUM 9.3  11/22/2021   GFRAA >60 04/14/2019   QFTBGOLDPLUS NEGATIVE 08/09/2021    Speciality Comments: No specialty comments available.  Procedures:  No procedures performed Allergies: Penicillins   Assessment / Plan:     Visit Diagnoses: Ankylosing spondylitis of multiple sites in spine (HCC) - HLA-B27 positive.  Dxd at [facility] at age 58.  History of uveitis, sacroiliitis, Achilles tendinitis, plantar fasciitis inflammatory arthritis: -He has not had any episodes of inflammatory arthritis, uveitis, sacroiliitis Achilles tendinitis of Planter fasciitis since the last visit.  He has some stiffness in his back and limited mobility.  We will refer him to physical therapy.  Plan: Ambulatory referral to Physical Therapy  Uveitis - History of recurrent uveitis since age 26.  He is followed by Dr. Everlena Cooper at [facility]  and Dr. Allyne Gee locally. Last uveitis flare was in October 2022.   High risk medication use - Declined Methotrexate, enbrel, and humira in the past.  - Plan: CBC with Differential/Platelet, COMPLETE METABOLIC PANEL WITH GFR  Chronic SI joint pain - X-ray showed bilateral SI joint sclerosis.  No SI joint narrowing or erosive changes were noted.   Chronic midline low back pain without sciatica - Facet joint arthropathy noted on the x-rays.  -He continues to have some lower back discomfort and stiffness.  He would benefit from physical therapy.  Plan: Ambulatory referral to Physical Therapy  Pain in thoracic spine - Mild degenerative changes were noted on the x-rays. -I will refer him to physical therapy.  Plan: Ambulatory referral to Physical Therapy  Neck pain - X-rays were unremarkable.  He had good range of motion without stiffness.  Primary osteoarthritis of both hands - Clinical and radiographic findings were consistent with osteoarthritis.  He had left fourth PIP contracture which is an old injury.  Primary osteoarthritis of both feet - Clinical and radiographic findings were consistent with  osteoarthritis.  He denies any discomfort today.  Idiopathic chronic gout of multiple sites without tophus - allopurinol 200mg  by mouth daily and colchicine 1/2 tab by mouth daily.  -He is doing much better on allopurinol 200 mg p.o. daily.  He states when he increase allopurinol to 300 mg he felt some palpitations but he is not sure if the symptoms were related to allopurinol.  He will continue on allopurinol 200 mg p.o. daily and use colchicine on as needed basis.  His last uric acid level on November 22, 2021 was 5.9.  Will repeat level again in 6 months.  Plan: Uric acid  Hyperuricemia  Other medical problems are listed as follows:  Chronic atrial fibrillation (HCC) - Followed by Dr. Hurman Horn  Essential hypertension  History of hyperlipidemia  Hypertrophic cardiomyopathy (HCC) - Diagnosed at age 69.  History of cardiomyopathy in his siblings.  His brother passed away from cardiomyopathy.  ICD (implantable cardioverter-defibrillator) in place  NSVT (nonsustained ventricular tachycardia)  History of gastroesophageal reflux (GERD)  Family history of ankylosing spondylitis - In his father, sister and brother per patient  Family history of cardiomyopathy - Father, sister and brother  Former smoker - Half a pack per day for 10 years.  He quit smoking in 2009.  Orders: Orders Placed This Encounter  Procedures   CBC with Differential/Platelet   COMPLETE METABOLIC PANEL WITH GFR   Uric acid   Ambulatory referral to Physical Therapy   No orders of the defined types were placed in this encounter.  .  Follow-Up Instructions: Return in about 6 months (around 07/05/2022) for Gout, Ankylosing spondylitis.   Pollyann Savoy, MD  Note - This record has been created using Animal nutritionist.  Chart creation errors have been sought, but may not always  have been located. Such creation errors do not reflect on  the standard of medical care.

## 2022-01-02 ENCOUNTER — Encounter: Payer: Self-pay | Admitting: Rheumatology

## 2022-01-02 ENCOUNTER — Ambulatory Visit: Payer: 59 | Admitting: Rheumatology

## 2022-01-02 ENCOUNTER — Other Ambulatory Visit: Payer: Self-pay

## 2022-01-02 VITALS — BP 130/80 | HR 65 | Ht 73.0 in | Wt 214.6 lb

## 2022-01-02 DIAGNOSIS — M19072 Primary osteoarthritis, left ankle and foot: Secondary | ICD-10-CM

## 2022-01-02 DIAGNOSIS — Z8249 Family history of ischemic heart disease and other diseases of the circulatory system: Secondary | ICD-10-CM

## 2022-01-02 DIAGNOSIS — M533 Sacrococcygeal disorders, not elsewhere classified: Secondary | ICD-10-CM | POA: Diagnosis not present

## 2022-01-02 DIAGNOSIS — I482 Chronic atrial fibrillation, unspecified: Secondary | ICD-10-CM

## 2022-01-02 DIAGNOSIS — M19041 Primary osteoarthritis, right hand: Secondary | ICD-10-CM | POA: Diagnosis not present

## 2022-01-02 DIAGNOSIS — M542 Cervicalgia: Secondary | ICD-10-CM

## 2022-01-02 DIAGNOSIS — Z95810 Presence of automatic (implantable) cardiac defibrillator: Secondary | ICD-10-CM

## 2022-01-02 DIAGNOSIS — Z79899 Other long term (current) drug therapy: Secondary | ICD-10-CM | POA: Diagnosis not present

## 2022-01-02 DIAGNOSIS — M450 Ankylosing spondylitis of multiple sites in spine: Secondary | ICD-10-CM | POA: Diagnosis not present

## 2022-01-02 DIAGNOSIS — I10 Essential (primary) hypertension: Secondary | ICD-10-CM

## 2022-01-02 DIAGNOSIS — H209 Unspecified iridocyclitis: Secondary | ICD-10-CM

## 2022-01-02 DIAGNOSIS — M546 Pain in thoracic spine: Secondary | ICD-10-CM | POA: Diagnosis not present

## 2022-01-02 DIAGNOSIS — I422 Other hypertrophic cardiomyopathy: Secondary | ICD-10-CM

## 2022-01-02 DIAGNOSIS — M545 Low back pain, unspecified: Secondary | ICD-10-CM | POA: Diagnosis not present

## 2022-01-02 DIAGNOSIS — Z8719 Personal history of other diseases of the digestive system: Secondary | ICD-10-CM

## 2022-01-02 DIAGNOSIS — M19071 Primary osteoarthritis, right ankle and foot: Secondary | ICD-10-CM | POA: Diagnosis not present

## 2022-01-02 DIAGNOSIS — M1A09X0 Idiopathic chronic gout, multiple sites, without tophus (tophi): Secondary | ICD-10-CM

## 2022-01-02 DIAGNOSIS — Z8639 Personal history of other endocrine, nutritional and metabolic disease: Secondary | ICD-10-CM

## 2022-01-02 DIAGNOSIS — E790 Hyperuricemia without signs of inflammatory arthritis and tophaceous disease: Secondary | ICD-10-CM

## 2022-01-02 DIAGNOSIS — Z8269 Family history of other diseases of the musculoskeletal system and connective tissue: Secondary | ICD-10-CM

## 2022-01-02 DIAGNOSIS — I4729 Other ventricular tachycardia: Secondary | ICD-10-CM

## 2022-01-02 DIAGNOSIS — G8929 Other chronic pain: Secondary | ICD-10-CM

## 2022-01-02 DIAGNOSIS — Z87891 Personal history of nicotine dependence: Secondary | ICD-10-CM

## 2022-01-02 NOTE — Patient Instructions (Signed)

## 2022-01-21 ENCOUNTER — Other Ambulatory Visit: Payer: Self-pay | Admitting: Internal Medicine

## 2022-02-24 ENCOUNTER — Other Ambulatory Visit: Payer: Self-pay | Admitting: Physician Assistant

## 2022-02-24 DIAGNOSIS — M1A09X0 Idiopathic chronic gout, multiple sites, without tophus (tophi): Secondary | ICD-10-CM

## 2022-02-25 NOTE — Telephone Encounter (Signed)
Next Visit: 07/08/2022 ? ?Last Visit: 01/02/2022 ? ?Last Fill: 11/23/2021 ? ?DX: Idiopathic chronic gout of multiple sites without tophus  ? ?Current Dose per office note 01/02/2022: allopurinol 200 mg p.o. daily  ? ?Labs: 11/22/2021 LFTs are elevated.  Rest of CMP WNL.  CBC WNL.  Uric acid has improved to 5.9 which is within the desirable range.  ? ?Okay to refill Allopurinol?  ?

## 2022-02-26 ENCOUNTER — Ambulatory Visit (INDEPENDENT_AMBULATORY_CARE_PROVIDER_SITE_OTHER): Payer: 59

## 2022-02-26 DIAGNOSIS — I10 Essential (primary) hypertension: Secondary | ICD-10-CM | POA: Diagnosis not present

## 2022-02-26 DIAGNOSIS — Z8719 Personal history of other diseases of the digestive system: Secondary | ICD-10-CM | POA: Diagnosis not present

## 2022-02-26 DIAGNOSIS — K219 Gastro-esophageal reflux disease without esophagitis: Secondary | ICD-10-CM | POA: Diagnosis not present

## 2022-02-26 DIAGNOSIS — H209 Unspecified iridocyclitis: Secondary | ICD-10-CM | POA: Diagnosis not present

## 2022-02-26 DIAGNOSIS — E785 Hyperlipidemia, unspecified: Secondary | ICD-10-CM | POA: Diagnosis not present

## 2022-02-26 DIAGNOSIS — I422 Other hypertrophic cardiomyopathy: Secondary | ICD-10-CM | POA: Diagnosis not present

## 2022-02-26 DIAGNOSIS — Z95810 Presence of automatic (implantable) cardiac defibrillator: Secondary | ICD-10-CM | POA: Diagnosis not present

## 2022-02-26 DIAGNOSIS — R69 Illness, unspecified: Secondary | ICD-10-CM | POA: Diagnosis not present

## 2022-02-26 DIAGNOSIS — Z789 Other specified health status: Secondary | ICD-10-CM | POA: Diagnosis not present

## 2022-02-26 DIAGNOSIS — M459 Ankylosing spondylitis of unspecified sites in spine: Secondary | ICD-10-CM | POA: Diagnosis not present

## 2022-02-26 DIAGNOSIS — Z8739 Personal history of other diseases of the musculoskeletal system and connective tissue: Secondary | ICD-10-CM | POA: Diagnosis not present

## 2022-02-26 DIAGNOSIS — Z7689 Persons encountering health services in other specified circumstances: Secondary | ICD-10-CM | POA: Diagnosis not present

## 2022-02-26 LAB — CUP PACEART REMOTE DEVICE CHECK
Battery Remaining Percentage: 61 %
Date Time Interrogation Session: 20230509103900
Implantable Lead Implant Date: 20191129
Implantable Lead Location: 753862
Implantable Lead Model: 3501
Implantable Lead Serial Number: 163240
Implantable Pulse Generator Implant Date: 20191129
Pulse Gen Serial Number: 251605

## 2022-03-12 NOTE — Progress Notes (Signed)
Remote ICD transmission.   

## 2022-04-17 ENCOUNTER — Other Ambulatory Visit: Payer: Self-pay | Admitting: Internal Medicine

## 2022-04-17 MED ORDER — METOPROLOL SUCCINATE ER 100 MG PO TB24: ORAL_TABLET | ORAL | 0 refills | Status: DC

## 2022-05-11 ENCOUNTER — Other Ambulatory Visit: Payer: Self-pay | Admitting: Internal Medicine

## 2022-05-13 ENCOUNTER — Telehealth: Payer: Self-pay | Admitting: Internal Medicine

## 2022-05-13 MED ORDER — METOPROLOL SUCCINATE ER 100 MG PO TB24: ORAL_TABLET | ORAL | 0 refills | Status: DC

## 2022-05-13 NOTE — Telephone Encounter (Signed)
Pt's medication was sent to pt's pharmacy as requested. Confirmation received.  °

## 2022-05-13 NOTE — Telephone Encounter (Signed)
*  STAT* If patient is at the pharmacy, call can be transferred to refill team.   1. Which medications need to be refilled? (please list name of each medication and dose if known)   metoprolol succinate (TOPROL-XL) 100 MG 24 hr tablet    2. Which pharmacy/location (including street and city if local pharmacy) is medication to be sent to? [id number] - [place], [place] - [address]  3. Do they need a 30 day or 90 day supply? 90  Pt made a follow up appt.

## 2022-05-22 NOTE — Progress Notes (Unsigned)
Electrophysiology Office Note Date: 05/23/2022  ID:  Jake Huff, DOB [birth year], MRN [id number]  PCP: Jake Cloud, MD Primary Cardiologist: None Electrophysiologist: Jake Manges, MD   CC: Routine ICD follow-up  Jake Huff is a 46 y.o. male seen today for Jake Manges, MD for routine electrophysiology followup.  Since last being seen in our clinic the patient reports doing very well overall. He continues to have rare palpitations, usually when working out. EKGs on his watch consistent with PVCs.  he denies chest pain, dyspnea, PND, orthopnea, nausea, vomiting, dizziness, syncope, edema, weight gain, or early satiety. He has not had ICD shocks.   Device History: BSCi S-ICD implanted 09/18/2018 primary prevention No history of therapies Device is involved in lead advisory Pt was demonstrated today alert tone Discussed device advisory  Past Medical History:  Diagnosis Date   Hypertension    Hypertrophic cardiomyopathy (HCC)    Sinus tachycardia ? autonomic    SVT (supraventricular tachycardia) (HCC) s/p AV nodal modification    Past Surgical History:  Procedure Laterality Date   FINGER SURGERY Left    ring finger   SUBQ ICD IMPLANT N/A 09/18/2018   Procedure: SUBQ ICD IMPLANT;  Surgeon: Duke Salvia, MD;  Location: [facility] INVASIVE CV LAB;  Service: Cardiovascular;  Laterality: N/A;    Current Outpatient Medications  Medication Sig Dispense Refill   allopurinol (ZYLOPRIM) 100 MG tablet TAKE 2 TABLETS BY MOUTH EVERY DAY 60 tablet 2   ALPRAZolam (XANAX) 0.25 MG tablet Take 0.25 mg by mouth 2 (two) times daily as needed for anxiety.      atorvastatin (LIPITOR) 20 MG tablet Take 20 mg by mouth daily.     cetirizine (ZYRTEC) 10 MG tablet Take 10 mg by mouth daily as needed for allergies.      colchicine 0.6 MG tablet TAKE 0.5 TABLETS BY MOUTH DAILY. 15 tablet 2   Difluprednate (DUREZOL OP) Apply to eye as needed.     fluticasone (FLONASE) 50 MCG/ACT nasal spray Place 1  spray into both nostrils daily as needed for allergies or rhinitis.     metoprolol succinate (TOPROL-XL) 100 MG 24 hr tablet TAKE 1 TAB BY MOUTH IN THE MORNING AND AT BEDTIME WITH OR IMMEDIATELY FOLLOWING A MEAL 180 tablet 0   omeprazole (PRILOSEC) 40 MG capsule Take 40 mg by mouth every evening.     propranolol (INDERAL) 20 MG tablet TAKE 1 TABLET BY MOUTH EVERY 4 HOURS AS NEEDED FOR TACHY PALPITATIONS 90 tablet 3   verapamil (VERELAN PM) 180 MG 24 hr capsule Take 180 mg by mouth daily.     No current facility-administered medications for this visit.    Allergies:   Penicillins   Social History: Social History   Socioeconomic History   Marital status: Married    Spouse name: Not on file   Number of children: Not on file   Years of education: Not on file   Highest education level: Not on file  Occupational History   Not on file  Tobacco Use   Smoking status: Former    Types: Cigarettes    Quit date: 12/05/2008    Years since quitting: 13.4    Passive exposure: Never   Smokeless tobacco: Never  Vaping Use   Vaping Use: Never used  Substance and Sexual Activity   Alcohol use: Yes    Comment: occ   Drug use: Never   Sexual activity: Not on file  Other Topics Concern   Not on file  Social History Narrative   Not on file   Social Determinants of Health   Financial Resource Strain: Not on file  Food Insecurity: Not on file  Transportation Needs: Not on file  Physical Activity: Not on file  Stress: Not on file  Social Connections: Not on file  Intimate Partner Violence: Not on file    Family History: Family History  Problem Relation Age of Onset   Heart disease Father    Ankylosing spondylitis Father    Ankylosing spondylitis Sister    Heart disease Sister    Heart disease Brother    Healthy Daughter     Review of Systems: All other systems reviewed and are otherwise negative except as noted above.   Physical Exam: Vitals:   05/23/22 1136  BP: 122/76   Pulse: 69  SpO2: 98%  Weight: 214 lb 3.2 oz (97.2 kg)  Height: 6\' 2"  (1.88 m)     GEN- The patient is well appearing, alert and oriented x 3 today.   HEENT: normocephalic, atraumatic; sclera clear, conjunctiva pink; hearing intact; oropharynx clear; neck supple, no JVP Lymph- no cervical lymphadenopathy Lungs- Clear to ausculation bilaterally, normal work of breathing.  No wheezes, rales, rhonchi Heart- Regular rate and rhythm, no murmurs, rubs or gallops, PMI not laterally displaced GI- soft, non-tender, non-distended, bowel sounds present, no hepatosplenomegaly Extremities- no clubbing or cyanosis. No edema; DP/PT/radial pulses 2+ bilaterally MS- no significant deformity or atrophy Skin- warm and dry, no rash or lesion; ICD pocket well healed Psych- euthymic mood, full affect Neuro- strength and sensation are intact  ICD interrogation- reviewed in detail today,  See PACEART report  EKG:  EKG is ordered today. Personal review of EKG ordered today shows NSR at 69 bpm, no PVCs  Recent Labs: 11/22/2021: BUN 12; Creat 0.94; Hemoglobin 14.8; Platelets 301; Potassium 4.1; Sodium 141 12/13/2021: ALT 44   Wt Readings from Last 3 Encounters:  05/23/22 214 lb 3.2 oz (97.2 kg)  01/02/22 214 lb 9.6 oz (97.3 kg)  10/03/21 217 lb 3.2 oz (98.5 kg)     Other studies Reviewed: Additional studies/ records that were reviewed today include: Previous EP office notes.   Assessment and Plan:   Hypertrophic cardiomyopathy with gadolinium enhancement   VT nonsustained   SICD    - [employer]   Class I recall for risk of lead fracture (occurring just distal to the proximal electrode resulting in inappropriate shocks and an inability to deliver an appropriate shock--to exclude the distal electrode resulting no warning pretended by inappropriate shocks)   Sinus tachycardia doubt atrial tachycardia probably autonomic   Chest pain   Fatigue  euvolemic today Stable on an appropriate medical  regimen Normal ICD function See Pace Art report No changes today  Palpitations are not bothersome enough to him to consider changing his medical therapy at this time.   As previous, the patient's SICD lead is subject to a grade one recall because of risk of fracture of the lead just distal to the proximal electrode o  resulting in potential for inappropriate shocks and an inability to deliver an appropriate shock.  We have discussed the risk of this event and given the patients status as primary  we have elected to continue to monitor and not   replace the lead at this time. We have discussed the benefits and risks of programming sensing to include or exclude the distal electrode.  In its exclusion, there would be no warnings from inappropriate electrograms but there  is no risk of inappropriate shocks.  Inclusion results in the opposite.   We have reviewed the importance of regular interrogation of the device so as to have some opportunity for alerts, have demonstrated the beep tones and stressed the importance of [place] checking BEEP tones following MRI.    He has elected to keep it in a secondary configuration  Current medicines are reviewed at length with the patient today.     Disposition:   Follow up with Dr. Graciela Husbands in 12 months    Signed, Graciella Freer, PA-C  05/23/2022 11:54 AM  [facility] [address] [place] [address] [phone] (office) [phone] (fax)

## 2022-05-23 ENCOUNTER — Encounter: Payer: Self-pay | Admitting: Student

## 2022-05-23 ENCOUNTER — Ambulatory Visit: Payer: 59 | Admitting: Student

## 2022-05-23 VITALS — BP 122/76 | HR 69 | Ht 74.0 in | Wt 214.2 lb

## 2022-05-23 DIAGNOSIS — R000 Tachycardia, unspecified: Secondary | ICD-10-CM

## 2022-05-23 DIAGNOSIS — I422 Other hypertrophic cardiomyopathy: Secondary | ICD-10-CM

## 2022-05-23 DIAGNOSIS — Z95810 Presence of automatic (implantable) cardiac defibrillator: Secondary | ICD-10-CM

## 2022-05-23 DIAGNOSIS — L578 Other skin changes due to chronic exposure to nonionizing radiation: Secondary | ICD-10-CM | POA: Diagnosis not present

## 2022-05-23 DIAGNOSIS — D225 Melanocytic nevi of trunk: Secondary | ICD-10-CM | POA: Diagnosis not present

## 2022-05-23 DIAGNOSIS — L821 Other seborrheic keratosis: Secondary | ICD-10-CM | POA: Diagnosis not present

## 2022-05-23 DIAGNOSIS — I4729 Other ventricular tachycardia: Secondary | ICD-10-CM

## 2022-05-23 LAB — CBC
Hematocrit: 46 % (ref 37.5–51.0)
Hemoglobin: 15.4 g/dL (ref 13.0–17.7)
MCH: 31.6 pg (ref 26.6–33.0)
MCHC: 33.5 g/dL (ref 31.5–35.7)
MCV: 95 fL (ref 79–97)
Platelets: 330 10*3/uL (ref 150–450)
RBC: 4.87 x10E6/uL (ref 4.14–5.80)
RDW: 12.2 % (ref 11.6–15.4)
WBC: 7.5 10*3/uL (ref 3.4–10.8)

## 2022-05-23 LAB — BASIC METABOLIC PANEL
BUN/Creatinine Ratio: 12 (ref 9–20)
BUN: 11 mg/dL (ref 6–24)
CO2: 23 mmol/L (ref 20–29)
Calcium: 9.6 mg/dL (ref 8.7–10.2)
Chloride: 102 mmol/L (ref 96–106)
Creatinine, Ser: 0.93 mg/dL (ref 0.76–1.27)
Glucose: 112 mg/dL — ABNORMAL HIGH (ref 70–99)
Potassium: 4.2 mmol/L (ref 3.5–5.2)
Sodium: 139 mmol/L (ref 134–144)
eGFR: 103 mL/min/{1.73_m2} (ref >59)

## 2022-05-23 LAB — CUP PACEART INCLINIC DEVICE CHECK
Date Time Interrogation Session: 20230803121614
Implantable Lead Implant Date: 20191129
Implantable Lead Location: 753862
Implantable Lead Model: 3501
Implantable Lead Serial Number: 163240
Implantable Pulse Generator Implant Date: 20191129
Pulse Gen Serial Number: 251605

## 2022-05-23 NOTE — Patient Instructions (Signed)
Medication Instructions:  Your physician recommends that you continue on your current medications as directed. Please refer to the Current Medication list given to you today.  *If you need a refill on your cardiac medications before your next appointment, please call your pharmacy*   Lab Work: TODAY: BMET, CBC  If you have labs (blood work) drawn today and your tests are completely normal, you will receive your results only by: MyChart Message (if you have MyChart) OR A paper copy in the mail If you have any lab test that is abnormal or we need to change your treatment, we will call you to review the results.   Follow-Up: At [facility], you and your health needs are our priority.  As part of our continuing mission to provide you with exceptional heart care, we have created designated Provider Care Teams.  These Care Teams include your primary Cardiologist (physician) and Advanced Practice Providers (APPs -  Physician Assistants and Nurse Practitioners) who all work together to provide you with the care you need, when you need it.   Your next appointment:   1 year(s)  The format for your next appointment:   In Person  Provider:   Steven Klein, MD 

## 2022-05-25 ENCOUNTER — Other Ambulatory Visit: Payer: Self-pay | Admitting: Physician Assistant

## 2022-05-25 DIAGNOSIS — M1A09X0 Idiopathic chronic gout, multiple sites, without tophus (tophi): Secondary | ICD-10-CM

## 2022-05-27 NOTE — Telephone Encounter (Signed)
Next Visit: 07/08/2022   Last Visit: 01/02/2022   Last Fill: 02/25/2022   DX: Idiopathic chronic gout of multiple sites without tophus    Current Dose per office note 01/02/2022: allopurinol 200 mg p.o. daily    Labs: 05/23/2022 Glucose 112, 11/22/2021 Uric acid has improved to 5.9 which is within the desirable range.   Okay to refill Allopurinol?

## 2022-06-10 DIAGNOSIS — M545 Low back pain, unspecified: Secondary | ICD-10-CM | POA: Diagnosis not present

## 2022-06-25 NOTE — Progress Notes (Deleted)
Office Visit Note  Patient: Jake Huff             Date of Birth: 1976-03-29           MRN: [id number]             PCP: Rinaldo Cloud, MD Referring: Rinaldo Cloud, MD Visit Date: 07/08/2022 Occupation: @GUAROCC @  Subjective:    History of Present Illness: Jake Huff is a 46 y.o. male with history of ankylosing spondylitis, uveitis, gout, and osteoarthritis.  He is currently on allopurinol 200 mg daily and colchicine 0.6 mg half tablet by mouth daily.  Activities of Daily Living:  Patient reports morning stiffness for *** {minute/hour:19697}.   Patient {ACTIONS;DENIES/REPORTS:21021675::"Denies"} nocturnal pain.  Difficulty dressing/grooming: {ACTIONS;DENIES/REPORTS:21021675::"Denies"} Difficulty climbing stairs: {ACTIONS;DENIES/REPORTS:21021675::"Denies"} Difficulty getting out of chair: {ACTIONS;DENIES/REPORTS:21021675::"Denies"} Difficulty using hands for taps, buttons, cutlery, and/or writing: {ACTIONS;DENIES/REPORTS:21021675::"Denies"}  No Rheumatology ROS completed.   PMFS History:  Patient Active Problem List   Diagnosis Date Noted   NSVT (nonsustained ventricular tachycardia) (HCC) 02/27/2021   Sinus tachycardia 02/27/2021   ICD (implantable cardioverter-defibrillator) in place 02/27/2021   GERD (gastroesophageal reflux disease) 07/05/2020   Hypertrophic cardiomyopathy (HCC) 01/10/2020   Palpitations 01/10/2020   HYPERCHOLESTEROLEMIA 11/30/2009   Primary cardiomyopathy (HCC) 11/30/2009   CHEST PAIN, ATYPICAL, HX OF 11/30/2009    Past Medical History:  Diagnosis Date   Hypertension    Hypertrophic cardiomyopathy (HCC)    Sinus tachycardia ? autonomic    SVT (supraventricular tachycardia) (HCC) s/p AV nodal modification     Family History  Problem Relation Age of Onset   Heart disease Father    Ankylosing spondylitis Father    Ankylosing spondylitis Sister    Heart disease Sister    Heart disease Brother    Healthy Daughter    Past Surgical  History:  Procedure Laterality Date   FINGER SURGERY Left    ring finger   SUBQ ICD IMPLANT N/A 09/18/2018   Procedure: SUBQ ICD IMPLANT;  Surgeon: 09/20/2018, MD;  Location: [facility] INVASIVE CV LAB;  Service: Cardiovascular;  Laterality: N/A;   Social History   Social History Narrative   Not on file   Immunization History  Administered Date(s) Administered   Moderna Sars-Covid-2 Vaccination 12/23/2019, 01/29/2020     Objective: Vital Signs: There were no vitals taken for this visit.   Physical Exam Vitals and nursing note reviewed.  Constitutional:      Appearance: He is well-developed.  HENT:     Head: Normocephalic and atraumatic.  Eyes:     Conjunctiva/sclera: Conjunctivae normal.     Pupils: Pupils are equal, round, and reactive to light.  Cardiovascular:     Rate and Rhythm: Normal rate and regular rhythm.     Heart sounds: Normal heart sounds.  Pulmonary:     Effort: Pulmonary effort is normal.     Breath sounds: Normal breath sounds.  Abdominal:     General: Bowel sounds are normal.     Palpations: Abdomen is soft.  Musculoskeletal:     Cervical back: Normal range of motion and neck supple.  Skin:    General: Skin is warm and dry.     Capillary Refill: Capillary refill takes less than 2 seconds.  Neurological:     Mental Status: He is alert and oriented to person, place, and time.  Psychiatric:        Behavior: Behavior normal.      Musculoskeletal Exam: ***  CDAI Exam: CDAI Score: -- Patient Global: --;  Provider Global: -- Swollen: --; Tender: -- Joint Exam 07/08/2022   No joint exam has been documented for this visit   There is currently no information documented on the homunculus. Go to the Rheumatology activity and complete the homunculus joint exam.  Investigation: No additional findings.  Imaging: No results found.  Recent Labs: Lab Results  Component Value Date   WBC 7.5 05/23/2022   HGB 15.4 05/23/2022   PLT 330 05/23/2022   NA  139 05/23/2022   K 4.2 05/23/2022   CL 102 05/23/2022   CO2 23 05/23/2022   GLUCOSE 112 (H) 05/23/2022   BUN 11 05/23/2022   CREATININE 0.93 05/23/2022   BILITOT 0.7 12/13/2021   ALKPHOS 64 12/06/2011   AST 29 12/13/2021   ALT 44 12/13/2021   PROT 7.3 12/13/2021   ALBUMIN 4.8 12/06/2011   CALCIUM 9.6 05/23/2022   GFRAA >60 04/14/2019   QFTBGOLDPLUS NEGATIVE 08/09/2021    Speciality Comments: No specialty comments available.  Procedures:  No procedures performed Allergies: Penicillins   Assessment / Plan:     Visit Diagnoses: No diagnosis found.  Orders: No orders of the defined types were placed in this encounter.  No orders of the defined types were placed in this encounter.   Face-to-face time spent with patient was *** minutes. Greater than 50% of time was spent in counseling and coordination of care.  Follow-Up Instructions: No follow-ups on file.   Ellen Henri, CMA  Note - This record has been created using Animal nutritionist.  Chart creation errors have been sought, but may not always  have been located. Such creation errors do not reflect on  the standard of medical care.

## 2022-07-08 ENCOUNTER — Ambulatory Visit: Payer: 59 | Admitting: Physician Assistant

## 2022-07-08 DIAGNOSIS — M542 Cervicalgia: Secondary | ICD-10-CM

## 2022-07-08 DIAGNOSIS — Z8249 Family history of ischemic heart disease and other diseases of the circulatory system: Secondary | ICD-10-CM

## 2022-07-08 DIAGNOSIS — E790 Hyperuricemia without signs of inflammatory arthritis and tophaceous disease: Secondary | ICD-10-CM

## 2022-07-08 DIAGNOSIS — G8929 Other chronic pain: Secondary | ICD-10-CM

## 2022-07-08 DIAGNOSIS — Z95810 Presence of automatic (implantable) cardiac defibrillator: Secondary | ICD-10-CM

## 2022-07-08 DIAGNOSIS — M546 Pain in thoracic spine: Secondary | ICD-10-CM

## 2022-07-08 DIAGNOSIS — I4729 Other ventricular tachycardia: Secondary | ICD-10-CM

## 2022-07-08 DIAGNOSIS — M19041 Primary osteoarthritis, right hand: Secondary | ICD-10-CM

## 2022-07-08 DIAGNOSIS — Z8719 Personal history of other diseases of the digestive system: Secondary | ICD-10-CM

## 2022-07-08 DIAGNOSIS — Z79899 Other long term (current) drug therapy: Secondary | ICD-10-CM

## 2022-07-08 DIAGNOSIS — I10 Essential (primary) hypertension: Secondary | ICD-10-CM

## 2022-07-08 DIAGNOSIS — Z87891 Personal history of nicotine dependence: Secondary | ICD-10-CM

## 2022-07-08 DIAGNOSIS — M19071 Primary osteoarthritis, right ankle and foot: Secondary | ICD-10-CM

## 2022-07-08 DIAGNOSIS — I422 Other hypertrophic cardiomyopathy: Secondary | ICD-10-CM

## 2022-07-08 DIAGNOSIS — I482 Chronic atrial fibrillation, unspecified: Secondary | ICD-10-CM

## 2022-07-08 DIAGNOSIS — H209 Unspecified iridocyclitis: Secondary | ICD-10-CM

## 2022-07-08 DIAGNOSIS — Z8639 Personal history of other endocrine, nutritional and metabolic disease: Secondary | ICD-10-CM

## 2022-07-08 DIAGNOSIS — Z8269 Family history of other diseases of the musculoskeletal system and connective tissue: Secondary | ICD-10-CM

## 2022-07-08 DIAGNOSIS — M450 Ankylosing spondylitis of multiple sites in spine: Secondary | ICD-10-CM

## 2022-07-08 DIAGNOSIS — M1A09X0 Idiopathic chronic gout, multiple sites, without tophus (tophi): Secondary | ICD-10-CM

## 2022-07-17 ENCOUNTER — Other Ambulatory Visit: Payer: Self-pay | Admitting: Internal Medicine

## 2022-07-18 DIAGNOSIS — H44132 Sympathetic uveitis, left eye: Secondary | ICD-10-CM | POA: Diagnosis not present

## 2022-07-22 DIAGNOSIS — H44132 Sympathetic uveitis, left eye: Secondary | ICD-10-CM | POA: Diagnosis not present

## 2022-08-05 ENCOUNTER — Ambulatory Visit (INDEPENDENT_AMBULATORY_CARE_PROVIDER_SITE_OTHER): Payer: 59

## 2022-08-05 DIAGNOSIS — I429 Cardiomyopathy, unspecified: Secondary | ICD-10-CM

## 2022-08-06 LAB — CUP PACEART REMOTE DEVICE CHECK
Battery Remaining Percentage: 57 %
Date Time Interrogation Session: 20231016070200
Implantable Lead Implant Date: 20191129
Implantable Lead Location: 753862
Implantable Lead Model: 3501
Implantable Lead Serial Number: 163240
Implantable Pulse Generator Implant Date: 20191129
Pulse Gen Serial Number: 251605

## 2022-08-27 NOTE — Progress Notes (Signed)
Remote ICD transmission.   

## 2022-08-29 ENCOUNTER — Other Ambulatory Visit: Payer: Self-pay | Admitting: Rheumatology

## 2022-08-29 DIAGNOSIS — M1A09X0 Idiopathic chronic gout, multiple sites, without tophus (tophi): Secondary | ICD-10-CM

## 2022-09-03 DIAGNOSIS — Z8739 Personal history of other diseases of the musculoskeletal system and connective tissue: Secondary | ICD-10-CM | POA: Diagnosis not present

## 2022-09-03 DIAGNOSIS — Z79899 Other long term (current) drug therapy: Secondary | ICD-10-CM | POA: Diagnosis not present

## 2022-09-03 DIAGNOSIS — E785 Hyperlipidemia, unspecified: Secondary | ICD-10-CM | POA: Diagnosis not present

## 2022-09-03 DIAGNOSIS — Z23 Encounter for immunization: Secondary | ICD-10-CM | POA: Diagnosis not present

## 2022-09-03 DIAGNOSIS — R739 Hyperglycemia, unspecified: Secondary | ICD-10-CM | POA: Diagnosis not present

## 2022-09-03 DIAGNOSIS — Z789 Other specified health status: Secondary | ICD-10-CM | POA: Diagnosis not present

## 2022-09-03 DIAGNOSIS — I422 Other hypertrophic cardiomyopathy: Secondary | ICD-10-CM | POA: Diagnosis not present

## 2022-09-03 DIAGNOSIS — H209 Unspecified iridocyclitis: Secondary | ICD-10-CM | POA: Diagnosis not present

## 2022-09-03 DIAGNOSIS — Z95810 Presence of automatic (implantable) cardiac defibrillator: Secondary | ICD-10-CM | POA: Diagnosis not present

## 2022-09-03 DIAGNOSIS — K219 Gastro-esophageal reflux disease without esophagitis: Secondary | ICD-10-CM | POA: Diagnosis not present

## 2022-09-03 DIAGNOSIS — I10 Essential (primary) hypertension: Secondary | ICD-10-CM | POA: Diagnosis not present

## 2022-09-03 DIAGNOSIS — F419 Anxiety disorder, unspecified: Secondary | ICD-10-CM | POA: Diagnosis not present

## 2022-09-03 DIAGNOSIS — Z0000 Encounter for general adult medical examination without abnormal findings: Secondary | ICD-10-CM | POA: Diagnosis not present

## 2022-09-03 DIAGNOSIS — M459 Ankylosing spondylitis of unspecified sites in spine: Secondary | ICD-10-CM | POA: Diagnosis not present

## 2022-09-24 DIAGNOSIS — I429 Cardiomyopathy, unspecified: Secondary | ICD-10-CM | POA: Diagnosis not present

## 2022-09-24 DIAGNOSIS — K9041 Non-celiac gluten sensitivity: Secondary | ICD-10-CM | POA: Diagnosis not present

## 2022-09-24 DIAGNOSIS — R0789 Other chest pain: Secondary | ICD-10-CM | POA: Diagnosis not present

## 2022-09-24 DIAGNOSIS — K219 Gastro-esophageal reflux disease without esophagitis: Secondary | ICD-10-CM | POA: Diagnosis not present

## 2022-09-26 DIAGNOSIS — I421 Obstructive hypertrophic cardiomyopathy: Secondary | ICD-10-CM | POA: Diagnosis not present

## 2022-09-26 DIAGNOSIS — I422 Other hypertrophic cardiomyopathy: Secondary | ICD-10-CM | POA: Diagnosis not present

## 2022-09-27 ENCOUNTER — Encounter: Payer: Self-pay | Admitting: Internal Medicine

## 2022-09-27 ENCOUNTER — Telehealth: Payer: Self-pay

## 2022-09-27 NOTE — Telephone Encounter (Signed)
Error

## 2022-09-27 NOTE — Telephone Encounter (Signed)
Error clearance not needed

## 2022-11-05 DIAGNOSIS — F419 Anxiety disorder, unspecified: Secondary | ICD-10-CM | POA: Diagnosis not present

## 2022-11-05 DIAGNOSIS — R42 Dizziness and giddiness: Secondary | ICD-10-CM | POA: Diagnosis not present

## 2022-12-04 ENCOUNTER — Ambulatory Visit: Payer: 59

## 2022-12-04 DIAGNOSIS — I429 Cardiomyopathy, unspecified: Secondary | ICD-10-CM

## 2022-12-05 LAB — CUP PACEART REMOTE DEVICE CHECK
Battery Remaining Percentage: 53 %
Date Time Interrogation Session: 20240214194000
Implantable Lead Connection Status: 753985
Implantable Lead Implant Date: 20191129
Implantable Lead Location: 753862
Implantable Lead Model: 3501
Implantable Lead Serial Number: 163240
Implantable Pulse Generator Implant Date: 20191129
Pulse Gen Serial Number: 251605

## 2022-12-11 DIAGNOSIS — S61452A Open bite of left hand, initial encounter: Secondary | ICD-10-CM | POA: Diagnosis not present

## 2022-12-11 DIAGNOSIS — W5501XA Bitten by cat, initial encounter: Secondary | ICD-10-CM | POA: Diagnosis not present

## 2023-01-03 NOTE — Progress Notes (Signed)
Remote ICD transmission.   

## 2023-03-05 ENCOUNTER — Ambulatory Visit: Payer: 59

## 2023-03-10 ENCOUNTER — Telehealth: Payer: Self-pay | Admitting: Internal Medicine

## 2023-03-10 NOTE — Telephone Encounter (Signed)
Patient c/o Palpitations:  High priority if patient c/o lightheadedness, shortness of breath, or chest pain  How long have you had palpitations/irregular HR/ Afib? Are you having the symptoms now? Yes  Are you currently experiencing lightheadedness, SOB or CP? No  Do you have a history of afib (atrial fibrillation) or irregular heart rhythm? Yes  Have you checked your BP or HR? (document readings if available): No  Are you experiencing any other symptoms? Patient stated occasionally he gets lightheaded when this happens. Patient stated this has been happening everyday for the past two months. Patient stated its mainly fluttering, but his hr has been stable. Patient stated that this will happens off and on throughout the day. Please advise.

## 2023-03-10 NOTE — Telephone Encounter (Signed)
Transmission received 03/10/2023.

## 2023-03-10 NOTE — Telephone Encounter (Signed)
Pt states he will send a transmission in a few minutes.

## 2023-03-10 NOTE — Telephone Encounter (Signed)
Remote transmission received and reviewed. Normal device function. No alerts noted. Patient called and updated. Also confirmed that patient is aware of ED precautions. Appreciative of call.

## 2023-03-10 NOTE — Telephone Encounter (Signed)
Spoke with pt who complains of almost daily palpitations with mild lightheadedness over the past month.  He denies current CP, SOB or dizziness/fainting.  He does not have a current BP or HR but reports readings have been WNL. Requested pt send a device transmission for review.  Pt states he is not currently at home but will send later this afternoon.  Reviewed ED precautions and will await transmission.  Pt verbalizes understanding and agrees with current plan.

## 2023-04-30 ENCOUNTER — Other Ambulatory Visit: Payer: Self-pay | Admitting: Internal Medicine

## 2023-05-11 ENCOUNTER — Other Ambulatory Visit: Payer: Self-pay | Admitting: Internal Medicine

## 2023-05-23 ENCOUNTER — Ambulatory Visit (INDEPENDENT_AMBULATORY_CARE_PROVIDER_SITE_OTHER): Payer: 59

## 2023-05-23 ENCOUNTER — Ambulatory Visit: Payer: 59 | Attending: Internal Medicine | Admitting: Internal Medicine

## 2023-05-23 ENCOUNTER — Encounter: Payer: Self-pay | Admitting: Internal Medicine

## 2023-05-23 VITALS — BP 116/74 | HR 72 | Ht 74.0 in | Wt 199.8 lb

## 2023-05-23 DIAGNOSIS — R002 Palpitations: Secondary | ICD-10-CM | POA: Diagnosis not present

## 2023-05-23 DIAGNOSIS — I422 Other hypertrophic cardiomyopathy: Secondary | ICD-10-CM

## 2023-05-23 DIAGNOSIS — I4729 Other ventricular tachycardia: Secondary | ICD-10-CM

## 2023-05-23 DIAGNOSIS — Z95810 Presence of automatic (implantable) cardiac defibrillator: Secondary | ICD-10-CM

## 2023-05-23 DIAGNOSIS — R079 Chest pain, unspecified: Secondary | ICD-10-CM | POA: Diagnosis not present

## 2023-05-23 NOTE — Patient Instructions (Signed)
Medication Instructions:    ** Lipitor has been reviewed from your medication list  *If you need a refill on your cardiac medications before your next appointment, please call your pharmacy*   Lab Work: None ordered.  If you have labs (blood work) drawn today and your tests are completely normal, you will receive your results only by: MyChart Message (if you have MyChart) OR A paper copy in the mail If you have any lab test that is abnormal or we need to change your treatment, we will call you to review the results.   Testing/Procedures:  Your physician has requested that you have an echocardiogram. Echocardiography is a painless test that uses sound waves to create images of your heart. It provides your doctor with information about the size and shape of your heart and how well your heart's chambers and valves are working. This procedure takes approximately one hour. There are no restrictions for this procedure. Please do NOT wear cologne, perfume, aftershave, or lotions (deodorant is allowed). Please arrive 15 minutes prior to your appointment time.  Calcium Score CT - Your physician has requested that you have cardiac CT. Cardiac computed tomography (CT) is a painless test that uses an x-ray machine to take clear, detailed pictures of your heart. For further information please visit [email or web address] Please follow instruction sheet as given.     Follow-Up: At [facility], you and your health needs are our priority.  As part of our continuing mission to provide you with exceptional heart care, we have created designated Provider Care Teams.  These Care Teams include your primary Cardiologist (physician) and Advanced Practice Providers (APPs -  Physician Assistants and Nurse Practitioners) who all work together to provide you with the care you need, when you need it.  We recommend signing up for the patient portal called "MyChart".  Sign up information is provided on this  After Visit Summary.  MyChart is used to connect with patients for Virtual Visits (Telemedicine).  Patients are able to view lab/test results, encounter notes, upcoming appointments, etc.  Non-urgent messages can be sent to your provider as well.   To learn more about what you can do with MyChart, go to [email or web address].    Your next appointment:   12 months with Dr Graciela Husbands  Other Instructions ZIO XT- Long Term Monitor Instructions  Your physician has requested you wear a ZIO patch monitor for 14 days.  This is a single patch monitor. [employer] supplies one patch monitor per enrollment. Additional stickers are not available. Please do not apply patch if you will be having a Nuclear Stress Test,  Echocardiogram, Cardiac CT, MRI, or Chest Xray during the period you would be wearing the  monitor. The patch cannot be worn during these tests. You cannot remove and re-apply the  ZIO XT patch monitor.  Your ZIO patch monitor will be mailed 3 day USPS to your address on file. It may take 3-5 days  to receive your monitor after you have been enrolled.  Once you have received your monitor, please review the enclosed instructions. Your monitor  has already been registered assigning a specific monitor serial # to you.  Billing and Patient Assistance Program Information  We have supplied [employer] with any of your insurance information on file for billing purposes. [employer] offers a sliding scale Patient Assistance Program for patients that do not have  insurance, or whose insurance does not completely cover the cost of the ZIO monitor.  You must apply for  the Patient Assistance Program to qualify for this discounted rate.  To apply, please call [employer] at [phone], select option 4, select option 2, ask to apply for  Patient Assistance Program. Meredeth Ide will ask your household income, and how many people  are in your household. They will quote your out-of-pocket cost based on that information.   [employer] will also be able to set up a 110-month, interest-free payment plan if needed.  Applying the monitor   Shave hair from upper left chest.  Hold abrader disc by orange tab. Rub abrader in 40 strokes over the upper left chest as  indicated in your monitor instructions.  Clean area with 4 enclosed alcohol pads. Let dry.  Apply patch as indicated in monitor instructions. Patch will be placed under collarbone on left  side of chest with arrow pointing upward.  Rub patch adhesive wings for 2 minutes. Remove white label marked "1". Remove the white  label marked "2". Rub patch adhesive wings for 2 additional minutes.  While looking in a mirror, press and release button in center of patch. A small green light will  flash 3-4 times. This will be your only indicator that the monitor has been turned on.  Do not shower for the first 24 hours. You may shower after the first 24 hours.  Press the button if you feel a symptom. You will hear a small click. Record Date, Time and  Symptom in the Patient Logbook.  When you are ready to remove the patch, follow instructions on the last 2 pages of Patient  Logbook. Stick patch monitor onto the last page of Patient Logbook.  Place Patient Logbook in the blue and white box. Use locking tab on box and tape box closed  securely. The blue and white box has prepaid postage on it. Please place it in the mailbox as  soon as possible. Your physician should have your test results approximately 7 days after the  monitor has been mailed back to [facility].  Call [facility] Customer Care at [phone] if you have questions regarding  your ZIO XT patch monitor. Call them immediately if you see an orange light blinking on your  monitor.  If your monitor falls off in less than 4 days, contact our Monitor department at [phone].  If your monitor becomes loose or falls off after 4 days call [employer] at [phone] for  suggestions on securing your  monitor

## 2023-05-23 NOTE — Progress Notes (Unsigned)
Enrolled patient for a 14 day Zio XT  monitor to be mailed to patients home  °

## 2023-05-23 NOTE — Progress Notes (Unsigned)
Patient Care Team: Rinaldo Cloud, MD as PCP - General (Cardiology) Duke Salvia, MD as PCP - Electrophysiology (Cardiology)   HPI  Jake Huff is a 47 y.o. male Seen in follow-up for hypertrophic cardia myopathy without evidence of LVOT obstruction recently diagnosed and recurrent tachypalpitations.  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  Is able to exercise at the gym without difficulty as long as he is horizontal.  Some lightheadedness with leg presses albeit with reps of 15 or more.  No edema.  No nocturnal dyspnea.  Some vague chest discomforts over about 3 weeks ago now with recurrent chest discomfort that is pleuritic and point tender.  No interval fevers  Date Cr K Hgb  8/23 0.93 4.2 15.4            His sister is s/p myectomy and defibrillator implantation at [facility] testing was positive for myosin binding protein mutation       Records and Results Reviewed he also has an appointment to see soon  Past Medical History:  Diagnosis Date   Hypertension    Hypertrophic cardiomyopathy (HCC)    Sinus tachycardia ? autonomic    SVT (supraventricular tachycardia) (HCC) s/p AV nodal modification     Past Surgical History:  Procedure Laterality Date   FINGER SURGERY Left    ring finger   SUBQ ICD IMPLANT N/A 09/18/2018   Procedure: SUBQ ICD IMPLANT;  Surgeon: Duke Salvia, MD;  Location: [facility] INVASIVE CV LAB;  Service: Cardiovascular;  Laterality: N/A;    Current Meds  Medication Sig   allopurinol (ZYLOPRIM) 100 MG tablet TAKE 2 TABLETS BY MOUTH EVERY DAY   ALPRAZolam (XANAX) 0.25 MG tablet Take 0.25 mg by mouth 2 (two) times daily as needed for  anxiety.    atorvastatin (LIPITOR) 20 MG tablet Take 20 mg by mouth daily.   cetirizine (ZYRTEC) 10 MG tablet Take 10 mg by mouth daily as needed for allergies.    colchicine 0.6 MG tablet TAKE 0.5 TABLETS BY MOUTH DAILY.   Difluprednate (DUREZOL OP) Apply to eye as needed.   fluticasone (FLONASE) 50 MCG/ACT nasal spray Place 1 spray into both nostrils daily as needed for allergies or rhinitis.   metoprolol succinate (TOPROL-XL) 100 MG 24 hr tablet TAKE 1 TAB BY MOUTH IN THE MORNING AND AT BEDTIME WITH OR IMMEDIATELY FOLLOWING A MEAL   omeprazole (PRILOSEC) 40 MG capsule Take 40 mg by mouth every evening.   propranolol (INDERAL) 20 MG tablet TAKE 1 TABLET BY MOUTH EVERY 4 HOURS AS NEEDED FOR TACHY PALPITATIONS   verapamil (VERELAN PM) 180 MG 24 hr capsule Take 180 mg by mouth daily.    Allergies  Allergen Reactions   Penicillins Anaphylaxis, Hives and Swelling    Has patient had a PCN reaction causing immediate rash, facial/tongue/throat swelling, SOB or lightheadedness with hypotension: Yes Has patient had a PCN reaction causing severe rash involving mucus membranes or skin necrosis: No Has patient had a PCN reaction that required hospitalization: No Has patient had a PCN reaction occurring within the last 10 years: No If all of the above answers are "NO", then may proceed with Cephalosporin use.  Active Ambulatory Problems    Diagnosis Date Noted   HYPERCHOLESTEROLEMIA 11/30/2009   Primary cardiomyopathy (HCC) 11/30/2009   CHEST PAIN, ATYPICAL, HX OF 11/30/2009   Hypertrophic cardiomyopathy (HCC) 01/10/2020   Palpitations 01/10/2020   GERD (gastroesophageal reflux disease) 07/05/2020   NSVT (nonsustained ventricular tachycardia) (HCC) 02/27/2021   Sinus tachycardia 02/27/2021   ICD (implantable cardioverter-defibrillator) in place 02/27/2021   Resolved Ambulatory Problems    Diagnosis Date Noted   No Resolved Ambulatory Problems   Past Medical History:  Diagnosis Date    Hypertension    SVT (supraventricular tachycardia) (HCC) s/p AV nodal modification     Review of Systems negative except from HPI and PMH  Physical Exam BP 116/74   Pulse 72   Ht 6\' 2"  (1.88 m)   Wt 199 lb 12.8 oz (90.6 kg)   SpO2 97%   BMI 25.65 kg/m  Well developed and well nourished in no acute distress HENT normal Neck supple with JVP-flat Clear Device pocket well healed; without hematoma or erythema.  There is no tethering  Regular rate and rhythm, murmur Abd-soft with active BS No Clubbing cyanosis  edema Skin-warm and dry A & Oriented  Grossly normal sensory and motor function  ECG sinus at 72 Intervals 14/08/41 Inferolateral Q waves  Device function is normal. Programming changes none   See Paceart for details    Kardia monitor with palpitations sinus arrhythmia MRI was reviewed with the patient.  He has focal septal hypertrophy.  Assessment and  Plan  Hypertrophic cardiomyopathy with gadolinium enhancement  VT nonsustained  Palpitations  SICD      Class I recall for risk of lead fracture (occurring just distal to the proximal electrode resulting in inappropriate shocks and an inability to deliver an appropriate shock--to exclude the distal electrode resulting no warning pretended by inappropriate shocks)  Sinus tachycardia doubt atrial tachycardia probably autonomic  Chest pain  Bendopnea   Fatigue  No interval ventricular tachycardia which we are aware  Recurrent palpitations of 2 types.  One is "skipping "happening daily the other is "fluttering" happening monthly discombobulated rather than symptomatic.  Probably PVCs.  There is some atrial irregularity on his Kardia monitor.  Need to exclude atrial fibrillation  Chest discomfort is atypical.  Now pleuritic.  Will undertake a calcium score.  Previously on statin but no longer.  Shortness of breath is concerning.  The bendopnea is curious in the absence of evidence of volume overload.  Will  take echocardiogram both to look at left ventricular function as well as to look at right atrial dimensions and IVC behaviors for volume status.  With his HCM he certainly could have diastolic heart failure and benefit from a little bit of volume reduction      Current medicines are reviewed at length with the patient today .  The patient does not  have concerns regarding medicines.

## 2023-05-26 ENCOUNTER — Telehealth: Payer: Self-pay

## 2023-05-26 ENCOUNTER — Ambulatory Visit: Payer: 59 | Attending: Internal Medicine

## 2023-05-26 DIAGNOSIS — I422 Other hypertrophic cardiomyopathy: Secondary | ICD-10-CM

## 2023-05-26 DIAGNOSIS — R002 Palpitations: Secondary | ICD-10-CM

## 2023-05-26 DIAGNOSIS — Z95810 Presence of automatic (implantable) cardiac defibrillator: Secondary | ICD-10-CM

## 2023-05-26 DIAGNOSIS — I4729 Other ventricular tachycardia: Secondary | ICD-10-CM

## 2023-05-26 NOTE — Addendum Note (Signed)
Addended by: Alois Cliche on: 05/26/2023 01:23 PM   Modules accepted: Orders

## 2023-05-26 NOTE — Telephone Encounter (Signed)
Spoke with pt and advised Dr Graciela Husbands has requested he wear Zio for 14 days x 2.  Pt advised he will receive 2 monitors.  He will wear the first one for 14 days and then return.  Apply the next monitor and wear for 14 days and then return it.  Pt verbalizes understanding and thanked Charity fundraiser for the phone call.

## 2023-05-26 NOTE — Progress Notes (Unsigned)
Enrolled for [employer] to mail a ZIO XT long term holter monitor to the patients address on file.  Patient to apply after wearing 14 day ZIO XT ordered 05/23/23.

## 2023-05-27 DIAGNOSIS — R002 Palpitations: Secondary | ICD-10-CM

## 2023-06-04 ENCOUNTER — Ambulatory Visit: Payer: 59

## 2023-06-06 ENCOUNTER — Ambulatory Visit (HOSPITAL_COMMUNITY)
Admission: RE | Admit: 2023-06-06 | Discharge: 2023-06-06 | Disposition: A | Discharge status: Home / Self Care | Payer: 59 | Source: Ambulatory Visit | Attending: Internal Medicine | Admitting: Internal Medicine

## 2023-06-06 DIAGNOSIS — R079 Chest pain, unspecified: Secondary | ICD-10-CM | POA: Insufficient documentation

## 2023-06-10 ENCOUNTER — Ambulatory Visit (HOSPITAL_COMMUNITY): Payer: 59 | Attending: Cardiology

## 2023-06-10 DIAGNOSIS — I340 Nonrheumatic mitral (valve) insufficiency: Secondary | ICD-10-CM

## 2023-06-10 DIAGNOSIS — I422 Other hypertrophic cardiomyopathy: Secondary | ICD-10-CM | POA: Diagnosis not present

## 2023-06-10 DIAGNOSIS — R002 Palpitations: Secondary | ICD-10-CM | POA: Insufficient documentation

## 2023-06-11 DIAGNOSIS — I422 Other hypertrophic cardiomyopathy: Secondary | ICD-10-CM

## 2023-06-11 DIAGNOSIS — I4729 Other ventricular tachycardia: Secondary | ICD-10-CM | POA: Diagnosis not present

## 2023-06-11 DIAGNOSIS — R002 Palpitations: Secondary | ICD-10-CM | POA: Diagnosis not present

## 2023-06-11 DIAGNOSIS — Z95810 Presence of automatic (implantable) cardiac defibrillator: Secondary | ICD-10-CM

## 2023-06-16 DIAGNOSIS — R002 Palpitations: Secondary | ICD-10-CM | POA: Diagnosis not present

## 2023-06-25 ENCOUNTER — Encounter: Payer: Self-pay | Admitting: Internal Medicine

## 2023-06-26 DIAGNOSIS — I422 Other hypertrophic cardiomyopathy: Secondary | ICD-10-CM | POA: Diagnosis not present

## 2023-06-26 DIAGNOSIS — R002 Palpitations: Secondary | ICD-10-CM | POA: Diagnosis not present

## 2023-06-30 DIAGNOSIS — J029 Acute pharyngitis, unspecified: Secondary | ICD-10-CM | POA: Diagnosis not present

## 2023-06-30 DIAGNOSIS — R52 Pain, unspecified: Secondary | ICD-10-CM | POA: Diagnosis not present

## 2023-06-30 DIAGNOSIS — Z20822 Contact with and (suspected) exposure to covid-19: Secondary | ICD-10-CM | POA: Diagnosis not present

## 2023-07-08 LAB — ECHOCARDIOGRAM COMPLETE
AR max vel: 3.18 cm2
AV Area VTI: 3.39 cm2
AV Area mean vel: 3.14 cm2
AV Mean grad: 8.7 mmHg
AV Peak grad: 15.4 mmHg
Ao pk vel: 1.96 m/s
Area-P 1/2: 3.34 cm2
S' Lateral: 2.35 cm

## 2023-08-29 ENCOUNTER — Encounter: Payer: Self-pay | Admitting: Internal Medicine

## 2023-08-29 DIAGNOSIS — I422 Other hypertrophic cardiomyopathy: Secondary | ICD-10-CM

## 2023-09-03 ENCOUNTER — Ambulatory Visit: Payer: 59

## 2023-09-15 NOTE — Telephone Encounter (Signed)
Sounds like a good idea

## 2023-10-26 DIAGNOSIS — R051 Acute cough: Secondary | ICD-10-CM | POA: Diagnosis not present

## 2023-10-26 DIAGNOSIS — J329 Chronic sinusitis, unspecified: Secondary | ICD-10-CM | POA: Diagnosis not present

## 2023-10-28 ENCOUNTER — Telehealth: Payer: Self-pay

## 2023-10-28 NOTE — Telephone Encounter (Signed)
 [facility] Raynelle Fanning called.She wanted the last transmission the patient had. I faxed it over.

## 2023-11-08 ENCOUNTER — Other Ambulatory Visit: Payer: Self-pay | Admitting: Internal Medicine

## 2023-11-17 ENCOUNTER — Ambulatory Visit: Payer: 59 | Attending: Internal Medicine | Admitting: Internal Medicine

## 2023-11-17 VITALS — BP 118/80 | HR 91 | Ht 74.0 in | Wt 204.0 lb

## 2023-11-17 DIAGNOSIS — Z8241 Family history of sudden cardiac death: Secondary | ICD-10-CM | POA: Insufficient documentation

## 2023-11-17 DIAGNOSIS — I422 Other hypertrophic cardiomyopathy: Secondary | ICD-10-CM | POA: Diagnosis not present

## 2023-11-17 DIAGNOSIS — Z8249 Family history of ischemic heart disease and other diseases of the circulatory system: Secondary | ICD-10-CM | POA: Diagnosis not present

## 2023-11-17 DIAGNOSIS — I4729 Other ventricular tachycardia: Secondary | ICD-10-CM

## 2023-11-17 DIAGNOSIS — I471 Supraventricular tachycardia, unspecified: Secondary | ICD-10-CM | POA: Insufficient documentation

## 2023-11-17 DIAGNOSIS — Z95810 Presence of automatic (implantable) cardiac defibrillator: Secondary | ICD-10-CM | POA: Diagnosis not present

## 2023-11-17 DIAGNOSIS — R002 Palpitations: Secondary | ICD-10-CM

## 2023-11-17 DIAGNOSIS — R000 Tachycardia, unspecified: Secondary | ICD-10-CM

## 2023-11-17 NOTE — Patient Instructions (Signed)
Medication Instructions:  Your physician recommends that you continue on your current medications as directed. Please refer to the Current Medication list given to you today.  *If you need a refill on your cardiac medications before your next appointment, please call your pharmacy*   Lab Work: NONE If you have labs (blood work) drawn today and your tests are completely normal, you will receive your results only by: MyChart Message (if you have MyChart) OR A paper copy in the mail If you have any lab test that is abnormal or we need to change your treatment, we will call you to review the results.   Testing/Procedures: Your physician has requested that you have a stress echocardiogram. For further information please visit [email or web address] Please follow instruction sheet as given.  Please note: We ask at that you not bring children with you during ultrasound (echo/ vascular) testing. Due to room size and safety concerns, children are not allowed in the ultrasound rooms during exams. Our front office staff cannot provide observation of children in our lobby area while testing is being conducted. An adult accompanying a patient to their appointment will only be allowed in the ultrasound room at the discretion of the ultrasound technician under special circumstances. We apologize for any inconvenience.    Your physician has requested that you do Genetic Testing.    Follow-Up: At [facility], you and your health needs are our priority.  As part of our continuing mission to provide you with exceptional heart care, we have created designated Provider Care Teams.  These Care Teams include your primary Cardiologist (physician) and Advanced Practice Providers (APPs -  Physician Assistants and Nurse Practitioners) who all work together to provide you with the care you need, when you need it.   Your next appointment:   3 month(s)  Provider:   Riley Lam, MD

## 2023-11-17 NOTE — Progress Notes (Signed)
Cardiology Office Note:  .    Date:  11/17/2023  ID:  [name], DOB 1976-07-20, MRN [id number] PCP: Rinaldo Cloud, MD  [facility] Providers Cardiologist:  None Electrophysiologist:  Sherryl Manges, MD     CC: Symptomatic HCM Consulted for the evaluation of symptomatic HCM at the behest of Dr. Sharyn Lull   History of Present Illness: Marland Kitchen    Kirkland Figg is a 48 y.o. male with a history of non obstructive HCM, with MBP genetic mutation (MYBC3 gene mutation), with sister having prior myectomy and ICD and [facility].    Mr. 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 The patient reports experiencing irregular heartbeats and has a defibrillator implanted for safety (NSVT, SCD- Brother- SCD . Despite medication management, the patient continues to struggle with physical activity, particularly after eating. The patient describes a sensation of squeezing in the center of the chest during physical activity or bending over, sometimes accompanied by a feeling of impending faintness. These episodes can last from a few seconds to a couple of minutes and are frequent enough to interfere with daily activities.  The patient maintains a relatively active lifestyle, attending the gym about four days a week, and has a desk job. The patient notes that lightheadedness can occur during exercise, particularly when transitioning from a seated to standing position. The patient also reports a history of arrhythmias lasting for almost two months, which prompted the last medical consultation. Despite wearing a heart monitor and recording over a hundred episodes, the patient was told that the amount was not significant. The patient's energy levels fluctuate, with periods of fatigue lasting a few days.  The patient is currently on metoprolol 100mg  twice daily and verapamil, and reports that the verapamil has  been particularly helpful in managing arrhythmias and maintaining a normal heart rate. The patient has a strong family history of HCM, with multiple family members diagnosed with the condition and a brother who passed away at age 61 from a suspected cardiac event related to HCM. The patient has not undergone genetic testing but is aware of the family's myosin binding protein gene mutation.   Relevant histories: .  Social- Brother, SCD - Sister- MD; s/p myectomy at [facility], in clinical trial (suspect aficamten) - father, s/p Myectomy - Grandfather, s/p SCD - daughter- screened negative ROS: As per HPI.   Studies Reviewed: .   Cardiac Studies & Procedures     STRESS TESTS  NM MYOCAR MULTI W/SPECT W 07/17/2010  Narrative Clinical Data: Chest pain  NUCLEAR MEDICINE MYOCARDIAL PERFUSION IMAGING NUCLEAR MEDICINE LEFT VENTRICULAR WALL MOTION ANALYSIS NUCLEAR MEDICINE LEFT VENTRICULAR EJECTION FRACTION CALCULATION  Technique: Standard single day myocardial SPECT imaging was performed after resting intravenous injection of Tc-73m Myoview. After performance of protocol treadmill stress under supervision of cardiology staff, Myoview was injected intravenously and standard myocardial SPECT imaging was performed. Quantitative gated imaging was also performed to evaluate left ventricular wall motion and estimate left ventricular ejection fraction.  Radiopharmaceutical: 10+30 mCi Tc3m Myoview IV.  Comparison: 10/17/2008  Findings:    The patient did achieve target heart rate. The stress SPECT images demonstrate physiologic distribution of radiopharmaceutical. Rest images demonstrate no perfusion defects. The gated stress SPECT images demonstrate normal left ventricular myocardial thickening.  No focal wall motion abnormality is seen. Calculated left ventricular end-diastolic volume 65ml, end-systolic volume 24ml, ejection fraction of 64%.  IMPRESSION:  1. Negative for exercise-stress  induced ischemia.  2. Left ventricular ejection fraction 64%.  Provider: Arminda Resides, Hannah Beat, Shanin Stedge  ECHOCARDIOGRAM  ECHOCARDIOGRAM COMPLETE 06/10/2023  Narrative ECHOCARDIOGRAM REPORT    Patient Name:   Hameed Kolar Date of Exam: 06/10/2023 Medical Rec #:  [id number]     Height:       74.0 in Accession #:    [id number]    Weight:       199.8 lb Date of Birth:  09-19-1976      BSA:          2.173 m Patient Age:    47 years      BP:           116/74 mmHg Patient Gender: M             HR:           78 bpm. Exam Location:  [address]  Procedure: 2D Echo, 3D Echo, Cardiac Doppler and Color Doppler  Indications:    R00.2 Palpitations R42 HOCM  History:        Patient has prior history of Echocardiogram examinations, most recent 07/17/2018. Hypertrophic Cardiomyopathy, Defibrillator, Arrythmias:SVT, Signs/Symptoms:Chest Pain; Risk Factors:Former Smoker, Hypertension and Family History of Coronary Artery Disease.  Sonographer:    Farrel Conners RDCS Referring Phys: Duke Salvia  IMPRESSIONS   1. No significant LVOT gradient. Left ventricular ejection fraction, by estimation, is 60 to 65%. The left ventricle has normal function. The left ventricle has no regional wall motion abnormalities. Left ventricular diastolic parameters are indeterminate. 2. Right ventricular systolic function is normal. The right ventricular size is normal. 3. The mitral valve is normal in structure. Mild mitral valve regurgitation. No evidence of mitral stenosis. 4. The aortic valve is normal in structure. Aortic valve regurgitation is not visualized. No aortic stenosis is present. 5. The inferior vena cava is normal in size with greater than 50% respiratory variability, suggesting right atrial pressure of 3 mmHg.  FINDINGS Left Ventricle: No significant LVOT gradient. Left ventricular ejection fraction, by estimation, is 60 to 65%. The left ventricle has normal function. The left  ventricle has no regional wall motion abnormalities. The left ventricular internal cavity size was normal in size. There is no left ventricular hypertrophy. Left ventricular diastolic parameters are indeterminate.  Right Ventricle: The right ventricular size is normal. No increase in right ventricular wall thickness. Right ventricular systolic function is normal.  Left Atrium: Left atrial size was normal in size.  Right Atrium: Right atrial size was normal in size.  Pericardium: There is no evidence of pericardial effusion. Presence of epicardial fat layer.  Mitral Valve: The mitral valve is normal in structure. Mild mitral valve regurgitation. No evidence of mitral valve stenosis.  Tricuspid Valve: The tricuspid valve is normal in structure. Tricuspid valve regurgitation is not demonstrated. No evidence of tricuspid stenosis.  Aortic Valve: The aortic valve is normal in structure. Aortic valve regurgitation is not visualized. No aortic stenosis is present. Aortic valve mean gradient measures 8.7 mmHg. Aortic valve peak gradient measures 15.4 mmHg. Aortic valve area, by VTI measures 3.39 cm.  Pulmonic Valve: The pulmonic valve was normal in structure. Pulmonic valve regurgitation is trivial. No evidence of pulmonic stenosis.  Aorta: The aortic root is normal in size and structure.  Venous: The inferior vena cava is normal in size with greater than 50% respiratory variability, suggesting right atrial pressure of 3 mmHg.  IAS/Shunts: No atrial level shunt detected by color flow Doppler.   LEFT  VENTRICLE PLAX 2D LVIDd:         4.25 cm   Diastology LVIDs:         2.35 cm   LV e' medial:    8.27 cm/s LV PW:         0.75 cm   LV E/e' medial:  9.8 LV IVS:        1.00 cm   LV e' lateral:   10.00 cm/s LVOT diam:     2.20 cm   LV E/e' lateral: 8.1 LV SV:         136 LV SV Index:   62 LVOT Area:     3.80 cm  3D Volume EF: 3D EF:        64 % LV EDV:       106 ml LV ESV:       38 ml LV  SV:        68 ml  RIGHT VENTRICLE RV Basal diam:  3.60 cm RV S prime:     15.00 cm/s TAPSE (M-mode): 2.9 cm RVSP:           21.7 mmHg  LEFT ATRIUM             Index        RIGHT ATRIUM           Index LA diam:        4.40 cm 2.03 cm/m   RA Pressure: 3.00 mmHg LA Vol (A2C):   55.2 ml 25.41 ml/m  RA Area:     16.40 cm LA Vol (A4C):   78.2 ml 35.99 ml/m  RA Volume:   45.10 ml  20.76 ml/m LA Biplane Vol: 66.5 ml 30.61 ml/m AORTIC VALVE AV Area (Vmax):    3.18 cm AV Area (Vmean):   3.14 cm AV Area (VTI):     3.39 cm AV Vmax:           196.33 cm/s AV Vmean:          135.667 cm/s AV VTI:            0.401 m AV Peak Grad:      15.4 mmHg AV Mean Grad:      8.7 mmHg LVOT Vmax:         164.00 cm/s LVOT Vmean:        112.040 cm/s LVOT VTI:          0.357 m LVOT/AV VTI ratio: 0.89  AORTA Ao Root diam: 3.30 cm Ao Asc diam:  3.40 cm  MITRAL VALVE               TRICUSPID VALVE MV Area (PHT): cm         TR Peak grad:   18.7 mmHg MV Decel Time: 227 msec    TR Vmax:        216.00 cm/s MV E velocity: 80.95 cm/s  Estimated RAP:  3.00 mmHg MV A velocity: 65.88 cm/s  RVSP:           21.7 mmHg MV E/A ratio:  1.23 SHUNTS Systemic VTI:  0.36 m Systemic Diam: 2.20 cm  Kardie Tobb DO Electronically signed by Thomasene Ripple DO Signature Date/Time: 07/08/2023/2:37:59 PM    Final   MONITORS  LONG TERM MONITOR (3-14 DAYS) 06/26/2023  Narrative Indication:palpitations HCM  Duration: 7d  Findings HR  avg 83  Min 54-Max 149 PVCs Rare, less than 1%  PACs Rare, less than 1%  SVT Nonsustained 1  episodes; fastest 187 bpm for 8 beats   ; longest for same VT Nonsustained 1 episodes; fastest 190 bpm for 11 beats; longest for same   Triggered: with SVT and VT   Conclusions: Symptomatic SVT and VT-- both nonsustained   Recommendations Continue betablockers  CT SCANS  CT CARDIAC SCORING (SELF PAY ONLY) 06/06/2023  Addendum 06/07/2023  3:16 PM ADDENDUM REPORT: 06/07/2023  15:14  EXAM: OVER-READ INTERPRETATION  CT CHEST  The following report is an over-read performed by radiologist Dr. Narda Rutherford of [facility] on 06/07/2023. This over-read does not include interpretation of cardiac or coronary anatomy or pathology. The coronary calcium score interpretation by the cardiologist is attached.  COMPARISON:  Chest CT 05/09/2018  FINDINGS: Vascular: No aortic atherosclerosis. The included aorta is normal in caliber.  Mediastinum/nodes: No adenopathy or mass. Unremarkable esophagus.  Lungs: No focal airspace disease. Calcified nodule in the left upper lobe series 5, image 22, consistent with prior granulomatous disease, benign needing no further imaging follow-up. No pleural fluid. The included airways are patent.  Upper abdomen: No acute or unexpected findings.  Musculoskeletal: There are no acute or suspicious osseous abnormalities. Subcutaneous pacemaker tip in the anterior chest wall soft tissues.  IMPRESSION: No acute or unexpected extracardiac findings.   Electronically Signed By: Narda Rutherford M.D. On: 06/07/2023 15:14  Narrative CLINICAL DATA:  Risk stratification  EXAM: Coronary Calcium Score  TECHNIQUE: The patient was scanned on a Siemens Somatom 64 slice scanner. Axial non-contrast 3mm slices were carried out through the heart. The data set was analyzed on a dedicated work station and scored using the Agatson method.  FINDINGS: Non-cardiac: No significant non cardiac findings on limited lung and soft tissue windows. See separate report from [facility].  Ascending Aorta: Normal diameter 3.4 cm  Pericardium: Normal  Coronary arteries: No calcium noted  IMPRESSION: Coronary calcium score of 0.  Charlton Haws  Electronically Signed: By: Charlton Haws M.D. On: 06/06/2023 16:03  CARDIAC MRI  MR CARDIAC MORPHOLOGY W WO CONTRAST 08/10/2018  Narrative CLINICAL DATA:  Hypertrophic  Cardiomyopathy  EXAM: CARDIAC MRI  TECHNIQUE: The patient was scanned on a 1.5 Tesla Siemens magnet. A dedicated cardiac coil was used. Functional imaging was done using Fiesta sequences. 2,3, and 4 chamber views were done to assess for RWMA's. Modified Simpson's rule using a short axis stack was used to calculate an ejection fraction on a dedicated work [employer]. The patient received Gadavist . After 10 minutes inversion recovery sequences were used to assess for infiltration and scar tissue.  CONTRAST:  Gavavist  FINDINGS: Atria were of normal size. RV was normal in size and function. AV tri-leaflet and normal. Aortic root normal. No ASD/PFO/VSD. Anterior leaflet of mitral valve mildly thickened No SAM or significant MR noted  There was focal basal septal hypertrophy of 14 mm compared to the posterior wall which was only 5-6 mm. Quantitative EF was 62%. Delayed enhancement images showed diffuse uptake especially in the septum and inferior wall. Attempts at T1 mapping and ECV calculation made. ECV in septum appeared abnormal compared to the rest of the ventricle at 60 cc. And T1 value not diagnostic of abnormality at 1040  IMPRESSION: 1.Abnormal basal septal thickening 14 mm with delayed inversion recovery sequences showing diffuse uptake worse in the septum and inferior wall consistent with infiltrative cardiomyopathy such as hypertrophic cardiomyopathy Suggestion of abnormal ECV in the septum as well T1 map not diagnostic  2.Normal LV EF 62%  3.No  SAM or LVOT obstruction noted Some flow acceleration around sigmoid septum Some apical displacement of papillary muscles that are hypertrophied  4.No significant MR notes  5.  No pericardial effusion  Charlton Haws   Electronically Signed By: Charlton Haws M.D. On: 08/10/2018 17:45         RADIOLOGY Cardiac MRI: Septal thickness of 14 mm (2019)  DIAGNOSTIC Echocardiogram: Gradient of 17  mmHg Holter Monitor: Multiple episodes of arrhythmias, including supraventricular tachycardia (SVT) and non-sustained ventricular tachycardia (VT)  PATHOLOGY Genetic Testing: MYBPC3 gene mutation confirmed  Physical Exam:    VS:  BP 118/80 (BP Location: Right Arm)   Pulse 91   Ht 6\' 2"  (1.88 m)   Wt 204 lb (92.5 kg)   SpO2 97%   BMI 26.19 kg/m    Wt Readings from Last 3 Encounters:  11/17/23 204 lb (92.5 kg)  05/23/23 199 lb 12.8 oz (90.6 kg)  05/23/22 214 lb 3.2 oz (97.2 kg)    Gen: no distress, well nourished   Neck: No JVD Cardiac: No Rubs or Gallops, Systolic Murmur with standing, worse with handgrip, RRR, +2 radial pulses Respiratory: Clear to auscultation bilaterally, normal effort, normal  respiratory rate GI: Soft, nontender, non-distended  MS: No  edema;  moves all extremities Integument: Skin feels warm Neuro:  At time of evaluation, alert and oriented to person/place/time/situation  Psych: Normal affect, patient feels well   ASSESSMENT AND PLAN: .    Hypertrophic Cardiomyopathy (HCM) 48 year old with nonobstructive HCM and MYBC3 gene mutation. Symptoms include chest pain, lightheadedness, and fatigue, particularly postprandial or post-exertion. Echocardiogram showed a gradient of 17, not meeting obstruction criteria (>=30). Symptoms suggest possible obstructive HCM during physical activity. Family history includes multiple relatives with HCM and sudden cardiac death. Current medications: metoprolol 100 mg BID and verapamil, which control arrhythmias but may contribute to fatigue. Discussed genetic testing, gene therapy, and potential inclusion in gene therapy trials. Gene therapy is experimental with 14 participants to date. Discussed mavacamten and afacamten, EDGEWISE, and cardiac mitocyte therapy, noting the risk of systolic heart failure with AV nodal agents. - NYHA II-III - Order squat to stand and stress echocardiogram to assess for obstructive gradient during  physical activity - Evaluate for high-risk findings during exercise - Consider weaning off metoprolol and maintaining verapamil if obstructive HCM is confirmed - Discuss potential treatment options including mavacamten, afacamten, procedures, or surgeries if obstruction is confirmed - Follow up in 3 months if no obstruction is found, sooner if obstruction is confirmed  Arrhythmias Frequent episodes of nonsustained ventricular tachycardia (NVST) and supraventricular tachycardia (SVT) with palpitations and lightheadedness associated with SR. Current management with metoprolol and verapamil is partially effective. SVT may not be directly related to HCM, but nonsustained ventricular tachycardia could be. Symptoms may be more related to symptomatic HCM  - s/p ICD with no firing  Genetic Testing for HCM Strong family history of HCM with confirmed MYBC3 gene mutation in relatives. Patient has not undergone genetic testing but has a daughter who tested negative. Discussed benefits and limitations of genetic testing and gene therapy. Gene therapy is experimental with 14 participants to date. Discussed potential inclusion in gene therapy trials if nonobstructive HCM is confirmed. - Order genetic testing through [facility] - Discuss potential inclusion in gene therapy trials if nonobstructive HCM is confirmed  Time Spent Directly with Patient:   I have spent a total of 69 minutes with the patient reviewing notes, imaging, EKGs, labs, prior OSH testing and examining the patient  as well as establishing an assessment and plan that was discussed personally with the patient. Discussed disease state education, had him listen to his murmur, using cardiac modeling , and discuss research trials likely similar to what his sister is enrolled in.  He has seen EP with our group but not clinical cardiology.    Riley Lam, MD FASE [facility] Cardiologist [facility]  [address] [place],  [address] [place], [place] [address] [phone]  9:40 AM

## 2023-12-03 ENCOUNTER — Ambulatory Visit: Payer: 59

## 2023-12-15 ENCOUNTER — Telehealth: Payer: Self-pay | Admitting: Internal Medicine

## 2023-12-15 NOTE — Telephone Encounter (Signed)
 Office calling to make provider aware that if any future testing is needed for pt, a new order need to be sent. Please advise

## 2023-12-16 NOTE — Telephone Encounter (Signed)
 Called [facility] was advised that HCM sponsored testing was discontinued on 11/21/23.  Pt will have to pay for testing.  Sending to MD to advise.

## 2023-12-17 NOTE — Telephone Encounter (Signed)
 Called pt advised that Sponsored Genetic Testing for HCM through [facility] has be discontinued.   Sample pt sent in will be discarded.   Pt reports there is free testing through [facility] will try this for testing.

## 2023-12-31 ENCOUNTER — Telehealth (HOSPITAL_COMMUNITY): Payer: Self-pay | Admitting: *Deleted

## 2023-12-31 NOTE — Telephone Encounter (Signed)
 Left message on voicemail per DPR in reference to upcoming appointment scheduled on 01/05/2024 at 2:30 with detailed instructions given per Stress Test Requisition Sheet for the test. LM to arrive 30 minutes early, and that it is imperative to arrive on time for appointment to keep from having the test rescheduled. If you need to cancel or reschedule your appointment, please call the office within 24 hours of your appointment. Failure to do so may result in a cancellation of your appointment, and a $50 no show fee. Phone number given for call back for any questions. Daneil Dolin

## 2024-01-05 ENCOUNTER — Ambulatory Visit (HOSPITAL_COMMUNITY): Payer: 59

## 2024-01-05 ENCOUNTER — Ambulatory Visit (HOSPITAL_COMMUNITY)
Admission: RE | Admit: 2024-01-05 | Discharge: 2024-01-05 | Disposition: A | Discharge status: Home / Self Care | Payer: 59 | Source: Ambulatory Visit | Attending: Cardiology | Admitting: Cardiology

## 2024-01-05 DIAGNOSIS — I422 Other hypertrophic cardiomyopathy: Secondary | ICD-10-CM | POA: Diagnosis not present

## 2024-01-05 LAB — ECHOCARDIOGRAM STRESS TEST
Area-P 1/2: 3.05 cm2
S' Lateral: 2.6 cm

## 2024-01-12 ENCOUNTER — Encounter: Payer: Self-pay | Admitting: Internal Medicine

## 2024-02-25 ENCOUNTER — Ambulatory Visit: Payer: 59 | Attending: Internal Medicine | Admitting: Internal Medicine

## 2024-02-25 VITALS — BP 120/70 | HR 77 | Ht 74.0 in | Wt 205.0 lb

## 2024-02-25 DIAGNOSIS — I421 Obstructive hypertrophic cardiomyopathy: Secondary | ICD-10-CM | POA: Diagnosis not present

## 2024-02-25 DIAGNOSIS — I471 Supraventricular tachycardia, unspecified: Secondary | ICD-10-CM | POA: Diagnosis not present

## 2024-02-25 DIAGNOSIS — I4729 Other ventricular tachycardia: Secondary | ICD-10-CM

## 2024-02-25 DIAGNOSIS — R002 Palpitations: Secondary | ICD-10-CM | POA: Diagnosis not present

## 2024-02-25 MED ORDER — VERAPAMIL HCL ER 180 MG PO CP24: 180.0000 mg | ORAL_CAPSULE | Freq: Every day | ORAL | 0 refills | Status: DC

## 2024-02-25 MED ORDER — PANTOPRAZOLE SODIUM 40 MG PO TBEC: 40.0000 mg | DELAYED_RELEASE_TABLET | Freq: Every day | ORAL | 11 refills | Status: AC | PRN

## 2024-02-25 MED ORDER — MAVACAMTEN 5 MG PO CAPS: 5.0000 mg | ORAL_CAPSULE | Freq: Every day | ORAL | 0 refills | Status: DC

## 2024-02-25 NOTE — Patient Instructions (Addendum)
 Medication Instructions:  Your physician has recommended you make the following change in your medication:  STOP: verapamil on May 16th  START: mavacamten (Camzyos) 5 mg by mouth once daily.  Your tentative start date will be Mar 08, 2024.  This date may change.  STOP: omeprazole (Prilosec) START: pantoprazole (Protonix) 40 mg by mouth once daily as needed.   *If you need a refill on your cardiac medications before your next appointment, please call your pharmacy*  Lab Work: NONE  If you have labs (blood work) drawn today and your tests are completely normal, you will receive your results only by: MyChart Message (if you have MyChart) OR A paper copy in the mail If you have any lab test that is abnormal or we need to change your treatment, we will call you to review the results.  Testing/Procedures: You will need an Echocardiogram 4, 8 and 12 weeks after starting Camzyos.  Dates for Testing may change based on when you start Camzyos. Echo #1 Due June 11-16 Echo #2 Due July 9-14 Echo #3 Due Aug 6-11  Your physician has requested that you have an echocardiogram. Echocardiography is a painless test that uses sound waves to create images of your heart. It provides your doctor with information about the size and shape of your heart and how well your heart's chambers and valves are working. This procedure takes approximately one hour. There are no restrictions for this procedure. Please do NOT wear cologne, perfume, aftershave, or lotions (deodorant is allowed). Please arrive 15 minutes prior to your appointment time.  Please note: We ask at that you not bring children with you during ultrasound (echo/ vascular) testing. Due to room size and safety concerns, children are not allowed in the ultrasound rooms during exams. Our front office staff cannot provide observation of children in our lobby area while testing is being conducted. An adult accompanying a patient to their appointment will only  be allowed in the ultrasound room at the discretion of the ultrasound technician under special circumstances. We apologize for any inconvenience.   Follow-Up: At [facility], you and your health needs are our priority.  As part of our continuing mission to provide you with exceptional heart care, our providers are all part of one team.  This team includes your primary Cardiologist (physician) and Advanced Practice Providers or APPs (Physician Assistants and Nurse Practitioners) who all work together to provide you with the care you need, when you need it.  Your next appointment:   3 month(s)  Provider:   Gloriann Larger, MD     Other Instructions Please remember that you will have to call your specialty pharmacy to set up delivery of mavacamten (Camzyos).  If you have any questions feel free to contact our office.

## 2024-02-25 NOTE — Progress Notes (Signed)
 Cardiology Office Note:  .    Date:  02/25/2024  ID:  Jake Huff, DOB 1976/01/29, MRN [id number] PCP: Jake Bitters, DO  [facility] Providers Cardiologist:  None Electrophysiologist:  Jake Chandler, MD     CC: Follow up [facility]  History of Present Illness: Jake Huff    Jake Huff is a 48 y.o. 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  He experiences chest pain, fatigue, shortness of breath, and near-syncope, particularly after eating or with minimal exertion, such as walking ten steps to the kitchen. He describes a 'squeezing' sensation in the center of his chest and notes feeling faint and very uncomfortable. These symptoms have been persistent and sometimes worsen, impacting his daily activities.  He has a significant family history of hypertrophic cardiomyopathy and MYBC3 gene mutation. His sister underwent a myectomy and ICD placement and is currently participating in a clinical trial. His father also had a myectomy, and his grandfather experienced sudden cardiac death. His daughter has been screened and does not carry the gene mutation.  He is currently on a high dose of AV nodal agents, including 200 mg of verapamil and 200 mg of metoprolol  daily, which contribute to his fatigue. Despite this, he continues to experience symptoms. A recent stress echocardiogram revealed an LVOT gradient of 51 mmHg.  Discussed the use of AI scribe software for clinical note transcription with the patient, who gave verbal consent to proceed.   Relevant histories: .  Social  Brother, SCD - Sister- MD; s/p myectomy at [facility], in clinical trial (suspect aficamten) - father, s/p Myectomy - Grandfather, s/p SCD - daughter- screened negative ROS: As per HPI.   Studies Reviewed: .   Cardiac Studies & Procedures    ______________________________________________________________________________________________   STRESS TESTS  ECHOCARDIOGRAM STRESS TEST 01/05/2024  Narrative EXERCISE STRESS ECHO REPORT   --------------------------------------------------------------------------------  Patient Name:   Jake Huff Date of Exam: 01/05/2024 Medical Rec #:  [id number]     Height:       74.0 in Accession #:    [id number]    Weight:       204.0 lb Date of Birth:  1975/12/14      BSA:          2.192 m Patient Age:    48 years      BP:           135/88 mmHg Patient Gender: M             HR:           77 bpm. Exam Location:  [address]  Procedure: Limited Echo, Stress Echo, Cardiac Doppler and Limited Color Doppler  Indications:     I42.2 Hypertrophic cardiomyopathy  History:         Patient has prior history of Echocardiogram examinations, most recent 06/30/2023. Family hx of sudden death.  Sonographer:     Jake Huff Sonographer#2:   Jake Huff, RDCS Referring Phys:  [id number] [facility] Jake Huff Diagnosing Phys: Jake Lawman MD  IMPRESSIONS   1. Dynamic maneuvers performed to elicit LVOT/intracavitary gradient. At rest, no gradient is noted (HR 73 bpm). 2. The findings of the study demonstrate that a stress-induced outflow tract obstruction is present (peak LVOT gradient 51 mmHG). 3. With Valsalva, max instantaneous intracavitary gradient is 40 mmHg and max instantaneous LVOT gradient is 49 mmHg (HR 83 bpm). Mild MR. 4. With repetitive squat  to stand, max instantaneous LVOT gradient is 42 mmHg (HR 84 bpm). Exercise stress was indeterminate for gradient provocation (HR 98 bpm). Post exercise Valsalva demonstrates max instantaneous gradient of 51 mmHg (HR 83 bpm). 5. Baseline echo findings: LVEF 65-70%, basal-mid septal hypertrophy 14 mm. Normal RV size and function. Mild SAM, trivial MR, grade 1 diastolic dysfunction, trivial TR. 6. This is an inconclusive stress echocardiogram  for ischemia. Non-diagnostic study for ischemia due to protocol. 7. This is an indeterminate risk study for ischemia due to protocol.  FINDINGS  Exam Protocol: The patient exercised on a treadmill according to a Bruce protocol. Squat to Stand Provocation [facility]) Protocol + exercise stress.   Patient Performance: The patient exercised for 4 minutes and 31 seconds, achieving 7 METS. The baseline heart rate was 71 bpm. The heart rate at peak stress was 134 bpm. The target heart rate was calculated to be 146 bpm. The percentage of maximum predicted heart rate achieved was 78.0 %. The baseline blood pressure was 135/88 mmHg. The blood pressure at peak stress was 182/120 mmHg. The blood pressure response was normal. The patient developed fatigue during the stress exam. The symptoms resolved with rest. The patient's functional capacity was below average.  EKG: Resting EKG showed normal sinus rhythm with nonspecific ST-T wave changes and T wave abnormality. The patient developed baseline abnormalities during exercise. The stress EKG was not interpretable due to baseline abnormalities.   2D Echo Findings: The baseline ejection fraction was 70%. The peak ejection fraction at stress was 75%. Baseline regional wall motion abnormalities were not present. This is an inconclusive stress echocardiogram for ischemia. This is a non-diagnostic study for ischemia due to protocol.  Stress Doppler:  LVOT: The peak LVOT gradient at stress was 51 mmHG. The findings of the study demonstrate that a stress-induced outflow tract obstruction is present.   Jake Lawman MD Electronically signed on 01/05/2024 at 9:17:56 PM      Final   ECHOCARDIOGRAM  ECHOCARDIOGRAM COMPLETE 06/10/2023  Narrative ECHOCARDIOGRAM REPORT    Patient Name:   Jake Huff Date of Exam: 06/10/2023 Medical Rec #:  [id number]     Height:       74.0 in Accession #:    [id number]    Weight:       199.8 lb Date of Birth:  Jul 23, 1976       BSA:          2.173 m Patient Age:    47 years      BP:           116/74 mmHg Patient Gender: M             HR:           78 bpm. Exam Location:  [address]  Procedure: 2D Echo, 3D Echo, Cardiac Doppler and Color Doppler  Indications:    R00.2 Palpitations R42 HOCM  History:        Patient has prior history of Echocardiogram examinations, most recent 07/17/2018. Hypertrophic Cardiomyopathy, Defibrillator, Arrythmias:SVT, Signs/Symptoms:Chest Pain; Risk Factors:Former Smoker, Hypertension and Family History of Coronary Artery Disease.  Sonographer:    Ewing Holiday RDCS Referring Phys: STEVEN C KLEIN  IMPRESSIONS   1. No significant LVOT gradient. Left ventricular ejection fraction, by estimation, is 60 to 65%. The left ventricle has normal function. The left ventricle has no regional wall motion abnormalities. Left ventricular diastolic parameters are indeterminate. 2. Right ventricular systolic function is normal. The right ventricular size is normal. 3. The  mitral valve is normal in structure. Mild mitral valve regurgitation. No evidence of mitral stenosis. 4. The aortic valve is normal in structure. Aortic valve regurgitation is not visualized. No aortic stenosis is present. 5. The inferior vena cava is normal in size with greater than 50% respiratory variability, suggesting right atrial pressure of 3 mmHg.  FINDINGS Left Ventricle: No significant LVOT gradient. Left ventricular ejection fraction, by estimation, is 60 to 65%. The left ventricle has normal function. The left ventricle has no regional wall motion abnormalities. The left ventricular internal cavity size was normal in size. There is no left ventricular hypertrophy. Left ventricular diastolic parameters are indeterminate.  Right Ventricle: The right ventricular size is normal. No increase in right ventricular wall thickness. Right ventricular systolic function is normal.  Left Atrium: Left atrial size was  normal in size.  Right Atrium: Right atrial size was normal in size.  Pericardium: There is no evidence of pericardial effusion. Presence of epicardial fat layer.  Mitral Valve: The mitral valve is normal in structure. Mild mitral valve regurgitation. No evidence of mitral valve stenosis.  Tricuspid Valve: The tricuspid valve is normal in structure. Tricuspid valve regurgitation is not demonstrated. No evidence of tricuspid stenosis.  Aortic Valve: The aortic valve is normal in structure. Aortic valve regurgitation is not visualized. No aortic stenosis is present. Aortic valve mean gradient measures 8.7 mmHg. Aortic valve peak gradient measures 15.4 mmHg. Aortic valve area, by VTI measures 3.39 cm.  Pulmonic Valve: The pulmonic valve was normal in structure. Pulmonic valve regurgitation is trivial. No evidence of pulmonic stenosis.  Aorta: The aortic root is normal in size and structure.  Venous: The inferior vena cava is normal in size with greater than 50% respiratory variability, suggesting right atrial pressure of 3 mmHg.  IAS/Shunts: No atrial level shunt detected by color flow Doppler.   LEFT VENTRICLE PLAX 2D LVIDd:         4.25 cm   Diastology LVIDs:         2.35 cm   LV e' medial:    8.27 cm/s LV PW:         0.75 cm   LV E/e' medial:  9.8 LV IVS:        1.00 cm   LV e' lateral:   10.00 cm/s LVOT diam:     2.20 cm   LV E/e' lateral: 8.1 LV SV:         136 LV SV Index:   62 LVOT Area:     3.80 cm  3D Volume EF: 3D EF:        64 % LV EDV:       106 ml LV ESV:       38 ml LV SV:        68 ml  RIGHT VENTRICLE RV Basal diam:  3.60 cm RV S prime:     15.00 cm/s TAPSE (M-mode): 2.9 cm RVSP:           21.7 mmHg  LEFT ATRIUM             Index        RIGHT ATRIUM           Index LA diam:        4.40 cm 2.03 cm/m   RA Pressure: 3.00 mmHg LA Vol (A2C):   55.2 ml 25.41 ml/m  RA Area:     16.40 cm LA Vol (A4C):   78.2 ml 35.99 ml/m  RA Volume:   45.10 ml  20.76  ml/m LA Biplane Vol: 66.5 ml 30.61 ml/m AORTIC VALVE AV Area (Vmax):    3.18 cm AV Area (Vmean):   3.14 cm AV Area (VTI):     3.39 cm AV Vmax:           196.33 cm/s AV Vmean:          135.667 cm/s AV VTI:            0.401 m AV Peak Grad:      15.4 mmHg AV Mean Grad:      8.7 mmHg LVOT Vmax:         164.00 cm/s LVOT Vmean:        112.040 cm/s LVOT VTI:          0.357 m LVOT/AV VTI ratio: 0.89  AORTA Ao Root diam: 3.30 cm Ao Asc diam:  3.40 cm  MITRAL VALVE               TRICUSPID VALVE MV Area (PHT): cm         TR Peak grad:   18.7 mmHg MV Decel Time: 227 msec    TR Vmax:        216.00 cm/s MV E velocity: 80.95 cm/s  Estimated RAP:  3.00 mmHg MV A velocity: 65.88 cm/s  RVSP:           21.7 mmHg MV E/A ratio:  1.23 SHUNTS Systemic VTI:  0.36 m Systemic Diam: 2.20 cm  Kardie Tobb DO Electronically signed by Jerryl Morin DO Signature Date/Time: 07/08/2023/2:37:59 PM    Final    MONITORS  LONG TERM MONITOR (3-14 DAYS) 06/26/2023  Narrative Indication:palpitations HCM  Duration: 7d  Findings HR  avg 83  Min 54-Max 149 PVCs Rare, less than 1%  PACs Rare, less than 1%  SVT Nonsustained 1 episodes; fastest 187 bpm for 8 beats   ; longest for same VT Nonsustained 1 episodes; fastest 190 bpm for 11 beats; longest for same   Triggered: with SVT and VT   Conclusions: Symptomatic SVT and VT-- both nonsustained   Recommendations Continue betablockers   CT SCANS  CT CARDIAC SCORING (SELF PAY ONLY) 06/06/2023  Addendum 06/07/2023  3:16 PM ADDENDUM REPORT: 06/07/2023 15:14  EXAM: OVER-READ INTERPRETATION  CT CHEST  The following report is an over-read performed by radiologist Dr. Chadwick Colonel of [facility] on 06/07/2023. This over-read does not include interpretation of cardiac or coronary anatomy or pathology. The coronary calcium score interpretation by the cardiologist is attached.  COMPARISON:  Chest CT  05/09/2018  FINDINGS: Vascular: No aortic atherosclerosis. The included aorta is normal in caliber.  Mediastinum/nodes: No adenopathy or mass. Unremarkable esophagus.  Lungs: No focal airspace disease. Calcified nodule in the left upper lobe series 5, image 22, consistent with prior granulomatous disease, benign needing no further imaging follow-up. No pleural fluid. The included airways are patent.  Upper abdomen: No acute or unexpected findings.  Musculoskeletal: There are no acute or suspicious osseous abnormalities. Subcutaneous pacemaker tip in the anterior chest wall soft tissues.  IMPRESSION: No acute or unexpected extracardiac findings.   Electronically Signed By: Chadwick Colonel M.D. On: 06/07/2023 15:14  Narrative CLINICAL DATA:  Risk stratification  EXAM: Coronary Calcium Score  TECHNIQUE: The patient was scanned on a Siemens Somatom 64 slice scanner. Axial non-contrast 3mm slices were carried out through the heart. The data set was analyzed on a dedicated work station and scored using the  Agatson method.  FINDINGS: Non-cardiac: No significant non cardiac findings on limited lung and soft tissue windows. See separate report from [facility].  Ascending Aorta: Normal diameter 3.4 cm  Pericardium: Normal  Coronary arteries: No calcium noted  IMPRESSION: Coronary calcium score of 0.  Janelle Mediate  Electronically Signed: By: Janelle Mediate M.D. On: 06/06/2023 16:03   CARDIAC MRI  MR CARDIAC MORPHOLOGY W WO CONTRAST 08/10/2018  Narrative CLINICAL DATA:  Hypertrophic Cardiomyopathy  EXAM: CARDIAC MRI  TECHNIQUE: The patient was scanned on a 1.5 Tesla Siemens magnet. A dedicated cardiac coil was used. Functional imaging was done using Fiesta sequences. 2,3, and 4 chamber views were done to assess for RWMA's. Modified Simpson's rule using a short axis stack was used to calculate an ejection fraction on a dedicated work [employer]. The patient received Gadavist  . After 10 minutes inversion recovery sequences were used to assess for infiltration and scar tissue.  CONTRAST:  Gavavist  FINDINGS: Atria were of normal size. RV was normal in size and function. AV tri-leaflet and normal. Aortic root normal. No ASD/PFO/VSD. Anterior leaflet of mitral valve mildly thickened No SAM or significant MR noted  There was focal basal septal hypertrophy of 14 mm compared to the posterior wall which was only 5-6 mm. Quantitative EF was 62%. Delayed enhancement images showed diffuse uptake especially in the septum and inferior wall. Attempts at T1 mapping and ECV calculation made. ECV in septum appeared abnormal compared to the rest of the ventricle at 60 cc. And T1 value not diagnostic of abnormality at 1040  IMPRESSION: 1.Abnormal basal septal thickening 14 mm with delayed inversion recovery sequences showing diffuse uptake worse in the septum and inferior wall consistent with infiltrative cardiomyopathy such as hypertrophic cardiomyopathy Suggestion of abnormal ECV in the septum as well T1 map not diagnostic  2.Normal LV EF 62%  3.No SAM or LVOT obstruction noted Some flow acceleration around sigmoid septum Some apical displacement of papillary muscles that are hypertrophied  4.No significant MR notes  5.  No pericardial effusion  Janelle Mediate   Electronically Signed By: Janelle Mediate M.D. On: 08/10/2018 17:45   ______________________________________________________________________________________________      Physical Exam:    VS:  BP 120/70 (BP Location: Left Arm)   Pulse 77   Ht 6\' 2"  (1.88 m)   Wt 205 lb (93 kg)   SpO2 94%   BMI 26.32 kg/m    Wt Readings from Last 3 Encounters:  02/25/24 205 lb (93 kg)  11/17/23 204 lb (92.5 kg)  05/23/23 199 lb 12.8 oz (90.6 kg)    Gen: no distress  Neck: No JVD Cardiac: No Rubs or Gallops, harsh systolic murmur with standing, RRR +2  radial pulses Respiratory: Clear to auscultation bilaterally, normal effort, normal  respiratory rate GI: Soft, nontender, non-distended  MS: No  edema; moves all extremities Integument: Skin feels warm Neuro:  At time of evaluation, Huff and oriented to person/place/time/situation  Psych: Normal affect, patient feels warm   ASSESSMENT AND PLAN: .    Hypertrophic obstructive cardiomyopathy He has a family history of MYBC3 gene mutation and hypertrophic cardiomyopathy. Presents with chest pain, fatigue, shortness of breath, and near-syncope. Stress echocardiogram showed an LVOT gradient of 51 mmHg, confirming obstructive hypertrophic cardiomyopathy. Currently on high doses of verapamil and metoprolol , causing significant fatigue. Decision made to initiate mavacamten, a cardiac myosin inhibitor, due to its reversibility and potential to improve symptoms. Discussed risks of congestive heart failure if the  medication works too well, and the need for regular monitoring with echocardiograms. Alternatives include alcohol septal ablation and myectomy. Mavacamten preferred due to its non-invasive nature and reversibility. - Start mavacamten on Mar 08, 2024. - Stop verapamil on Mar 05, 2024. - Monitor with echocardiograms at 4, 8, and 12 weeks after starting mavacamten. - Schedule follow-up appointment in 2-3 months. - Discuss potential side effects of mavacamten, including dizziness and fatigue.  I do not suspect his NSVT will worsen but his palpitations might - Educate on the need for regular monitoring and potential drug interactions.  Fatigue due to medication Experiences significant fatigue attributed to high doses of verapamil and metoprolol , impacting quality of life and influencing medication adjustment. - Discontinue verapamil on Mar 05, 2024, to reduce fatigue. - Monitor for improvement in fatigue after starting mavacamten.  Time Spent Directly with Patient:   I have spent a total of 42  minutes with the patient reviewing notes, imaging, EKGs, labs, and examining the patient as well as establishing an assessment and plan that was discussed personally with the patient. Discussed disease state education, using shared decision making tools and cardiac modeling, and discussed genetic testing (declined, daughter screened negative).   2-3 months screening with me  Echo's need to show maximal LVOT gradient  Gloriann Larger, MD FASE [facility] Cardiologist [facility]  [address] [place], [address] [place], [place] [address] [phone]  10:49 AM

## 2024-02-26 ENCOUNTER — Telehealth: Payer: Self-pay | Admitting: Pharmacist

## 2024-02-26 ENCOUNTER — Encounter: Payer: Self-pay | Admitting: *Deleted

## 2024-02-26 DIAGNOSIS — Z006 Encounter for examination for normal comparison and control in clinical research program: Secondary | ICD-10-CM

## 2024-02-26 NOTE — Telephone Encounter (Signed)
 PA for Camzyos submitted. Key: [id number] Start form faxed

## 2024-02-26 NOTE — Research (Signed)
 Jake Huff met criteria for the Shared Decision Support Intervention in the Treatment of Obstructive Hypertrophic Cardiomyopathy. I discussed the project he had the opportunity to read the consent and ask questions . He agreed to participate and the questionnaires were completed.

## 2024-02-27 NOTE — Telephone Encounter (Signed)
 PA approved through 05/28/24

## 2024-03-02 NOTE — Telephone Encounter (Signed)
 Copay cost $724. Called pt. Drug company contacted him already and got him set up for copay card and [facility]. He has received his 30-day trial already.  Asking if he should go ahead and start.  Knows that he needs to stop verapamil  a few days beforehand.  Advised so that echo is did not need to be rescheduled that he keep the same plan of stopping verapamil  in May 16 and starting Camzyos  on May 19.  He will also stop omeprazole today.  Patient advised that he was also enrolled in co-pay assistance for his echoes.  He will be able to seek reimbursement for up to $2500 per year.

## 2024-03-03 ENCOUNTER — Ambulatory Visit: Payer: 59

## 2024-03-30 ENCOUNTER — Telehealth: Payer: Self-pay | Admitting: Internal Medicine

## 2024-03-30 NOTE — Telephone Encounter (Signed)
*  STAT* If patient is at the pharmacy, call can be transferred to refill team.   1. Which medications need to be refilled? (please list name of each medication and dose if known) new prescripiton for Camzyos  5 mg   2. Would you like to learn more about the convenience, safety, & potential cost savings by using the [facility]?   3. Are you open to using the [facility] (Type [facility].    4. Which pharmacy/location (including street and city if local pharmacy) is medication to be sent to?[facility] [phone]   5. Do they need a 30 day or 90 day supply? 30 days and refills

## 2024-03-31 ENCOUNTER — Ambulatory Visit (HOSPITAL_COMMUNITY)
Admission: RE | Admit: 2024-03-31 | Discharge: 2024-03-31 | Disposition: A | Discharge status: Home / Self Care | Source: Ambulatory Visit | Attending: Cardiology | Admitting: Cardiology

## 2024-03-31 DIAGNOSIS — I421 Obstructive hypertrophic cardiomyopathy: Secondary | ICD-10-CM

## 2024-03-31 LAB — ECHOCARDIOGRAM LIMITED: S' Lateral: 2.7 cm

## 2024-04-02 ENCOUNTER — Ambulatory Visit: Payer: Self-pay

## 2024-04-02 NOTE — Telephone Encounter (Signed)
-----   Message from Jann Melody sent at 04/02/2024  1:38 PM EDT ----- LVEF 65% LVOT 51 mm HG NYHA II symptoms No new medications Improvement in lightheadeness and post prandial symptoms - refill mavacamten  5 mg (this is his 4 week echo, his 8 week echo is scheduled for 04/28/24  Gloriann Larger, MD FASE [facility] Cardiologist [facility]  [address] [place], [place] [address] [phone]  1:40 PM  ----- Message ----- From: Interface, Three One Seven Sent: 03/31/2024   4:59 PM EDT To: Jann Melody, MD

## 2024-04-06 MED ORDER — MAVACAMTEN 5 MG PO CAPS: 5.0000 mg | ORAL_CAPSULE | Freq: Every day | ORAL | 0 refills | Status: DC

## 2024-04-06 NOTE — Telephone Encounter (Signed)
 Camzyos  5 mg sent to [facility].

## 2024-04-07 NOTE — Telephone Encounter (Signed)
 PSF has been updated on Camzyos  REMS portal and refill of med sent to specialty pharmacy on file.

## 2024-04-28 ENCOUNTER — Ambulatory Visit (HOSPITAL_COMMUNITY)
Admission: RE | Admit: 2024-04-28 | Discharge: 2024-04-28 | Disposition: A | Discharge status: Home / Self Care | Source: Ambulatory Visit | Attending: Cardiovascular Disease | Admitting: Cardiovascular Disease

## 2024-04-28 DIAGNOSIS — I340 Nonrheumatic mitral (valve) insufficiency: Secondary | ICD-10-CM

## 2024-04-28 DIAGNOSIS — I421 Obstructive hypertrophic cardiomyopathy: Secondary | ICD-10-CM | POA: Insufficient documentation

## 2024-05-03 ENCOUNTER — Telehealth: Payer: Self-pay | Admitting: Pharmacist

## 2024-05-03 ENCOUNTER — Other Ambulatory Visit: Payer: Self-pay | Admitting: Pharmacist

## 2024-05-03 NOTE — Telephone Encounter (Signed)
 PA form for renewal of Camzyos  faxed to insurance.

## 2024-05-04 ENCOUNTER — Telehealth: Payer: Self-pay | Admitting: Pharmacy Technician

## 2024-05-04 NOTE — Telephone Encounter (Signed)
 Form came in for camzyos  but prior auth already done yesterday

## 2024-05-04 NOTE — Telephone Encounter (Signed)
 PA approved through 05/04/25

## 2024-05-05 ENCOUNTER — Telehealth: Payer: Self-pay | Admitting: Internal Medicine

## 2024-05-05 ENCOUNTER — Encounter: Payer: Self-pay | Admitting: Internal Medicine

## 2024-05-05 LAB — ECHOCARDIOGRAM LIMITED
Area-P 1/2: 3.79 cm2
S' Lateral: 2.9 cm

## 2024-05-05 MED ORDER — MAVACAMTEN 5 MG PO CAPS: 5.0000 mg | ORAL_CAPSULE | Freq: Every day | ORAL | 0 refills | Status: DC

## 2024-05-05 NOTE — Addendum Note (Signed)
 Addended by: DRENA MARTINIS, Digna Countess L on: 05/05/2024 05:33 PM   Modules accepted: Orders

## 2024-05-05 NOTE — Telephone Encounter (Signed)
 Pt of Dr. Santo. Please advise on this RX request

## 2024-05-05 NOTE — Telephone Encounter (Signed)
 Patient notes that he is doing better.   Since last visit notes rare symptoms after eating . There are no interval hospital/ED visit.   Echo showed LVEF 65% and LVOT gradient 28 mm Hg  No chest pain and rare pressure .  No SOB/ rare DOE and no PND/Orthopnea.  No weight gain or leg swelling.  No palpitations or syncope .  No changes in therapy.  Mavacamten  5 mg  At 8 weeks may need squat to stand He is difficult to image, may be best served with Casey/Beth/Vanessa who have imaged him before.  Jake Leavens, MD FASE [facility] Cardiologist [facility]  [address] [place], [place] [address] [phone]  5:14 PM

## 2024-05-05 NOTE — Telephone Encounter (Signed)
 Updated [facility] portal. Sent in refill for patient's Mavacamten  5 mg by mouth daily.

## 2024-05-05 NOTE — Telephone Encounter (Signed)
*  STAT* If patient is at the pharmacy, call can be transferred to refill team.   1. Which medications need to be refilled? (please list name of each medication and dose if known) mavacamten  (CAMZYOS ) 5 MG CAPS capsule   2. Which pharmacy/location (including street and city if local pharmacy) is medication to be sent to?  [facility] [place] - [address]      3. Do they need a 30 day or 90 day supply? 90 day

## 2024-05-12 NOTE — Telephone Encounter (Signed)
 Medication sent to specialty pharmacy on 05/05/2024 per [facility] portal med sent out on 05/10/24.

## 2024-05-26 ENCOUNTER — Ambulatory Visit (HOSPITAL_COMMUNITY)
Admission: RE | Admit: 2024-05-26 | Discharge: 2024-05-26 | Disposition: A | Discharge status: Home / Self Care | Source: Ambulatory Visit | Attending: Cardiovascular Disease | Admitting: Cardiovascular Disease

## 2024-05-26 ENCOUNTER — Other Ambulatory Visit: Payer: Self-pay | Admitting: Internal Medicine

## 2024-05-26 DIAGNOSIS — I421 Obstructive hypertrophic cardiomyopathy: Secondary | ICD-10-CM | POA: Insufficient documentation

## 2024-05-26 DIAGNOSIS — I471 Supraventricular tachycardia, unspecified: Secondary | ICD-10-CM

## 2024-05-26 DIAGNOSIS — I4729 Other ventricular tachycardia: Secondary | ICD-10-CM

## 2024-05-26 DIAGNOSIS — R002 Palpitations: Secondary | ICD-10-CM

## 2024-05-27 ENCOUNTER — Ambulatory Visit: Payer: Self-pay | Admitting: Cardiology

## 2024-05-27 DIAGNOSIS — I421 Obstructive hypertrophic cardiomyopathy: Secondary | ICD-10-CM

## 2024-05-27 LAB — ECHOCARDIOGRAM SQUAT TO STAND
Area-P 1/2: 4.31 cm2
S' Lateral: 3 cm

## 2024-05-28 MED ORDER — MAVACAMTEN 5 MG PO CAPS: 5.0000 mg | ORAL_CAPSULE | Freq: Every day | ORAL | 2 refills | Status: DC

## 2024-05-28 NOTE — Telephone Encounter (Signed)
 Pt reports is feeling a lot better, less symptoms then prior to starting mavacamten . Has not started any new medications prescribed or OTC.  Had questions regarding vitamins that help with energy advised to contact PCP for recommendations.   Pt expresses understanding script sent to specialty pharmacy on file.  PSF updated in Camzyos  REMS portal.

## 2024-06-02 ENCOUNTER — Telehealth: Payer: Self-pay | Admitting: Pharmacy Technician

## 2024-06-02 NOTE — Telephone Encounter (Signed)
     Camzyos  said need renewal of pa but pa already approved again

## 2024-06-03 DIAGNOSIS — K529 Noninfective gastroenteritis and colitis, unspecified: Secondary | ICD-10-CM | POA: Diagnosis not present

## 2024-06-03 DIAGNOSIS — R197 Diarrhea, unspecified: Secondary | ICD-10-CM | POA: Diagnosis not present

## 2024-06-14 ENCOUNTER — Telehealth: Payer: Self-pay | Admitting: Pharmacy Technician

## 2024-06-14 ENCOUNTER — Other Ambulatory Visit (HOSPITAL_COMMUNITY): Payer: Self-pay

## 2024-06-14 NOTE — Telephone Encounter (Signed)
 Pharmacy Patient Advocate Encounter   Received notification from Fax that prior authorization for pantoprazole  is required/requested.   Insurance verification completed.   The patient is insured through [facility] .   Per test claim: PA required; PA submitted to above mentioned insurance via Latent Key/confirmation #/EOC [facility] Status is pending

## 2024-06-15 ENCOUNTER — Other Ambulatory Visit (HOSPITAL_BASED_OUTPATIENT_CLINIC_OR_DEPARTMENT_OTHER): Payer: Self-pay

## 2024-06-15 ENCOUNTER — Other Ambulatory Visit (HOSPITAL_COMMUNITY): Payer: Self-pay

## 2024-06-15 NOTE — Telephone Encounter (Signed)
 Pharmacy Patient Advocate Encounter  Received notification from [facility] that Prior Authorization for pantoprazole  has been APPROVED from 06/15/24 to 06/15/25. Ran test claim, Copay is $4.11- one month only. This test claim was processed through [facility]- copay amounts may vary at other pharmacies due to pharmacy/plan contracts, or as the patient moves through the different stages of their insurance plan.   PA #/Case ID/Reference #: [id number]

## 2024-07-02 NOTE — Addendum Note (Signed)
 Addended by: RANDY HAMP SAILOR on: 07/02/2024 03:04 PM   Modules accepted: Orders

## 2024-08-23 ENCOUNTER — Ambulatory Visit

## 2024-08-23 DIAGNOSIS — I422 Other hypertrophic cardiomyopathy: Secondary | ICD-10-CM

## 2024-08-23 LAB — CUP PACEART REMOTE DEVICE CHECK
Battery Remaining Percentage: 33 %
Date Time Interrogation Session: 20251101154200
HighPow Impedance: 75 Ohm
Implantable Lead Connection Status: 753985
Implantable Lead Implant Date: 20191129
Implantable Lead Location: 753862
Implantable Lead Model: 3501
Implantable Lead Serial Number: 163240
Implantable Pulse Generator Implant Date: 20191129
Pulse Gen Serial Number: 251605

## 2024-08-26 NOTE — Progress Notes (Signed)
 Remote ICD Transmission

## 2024-08-27 ENCOUNTER — Telehealth: Payer: Self-pay | Admitting: Internal Medicine

## 2024-08-27 MED ORDER — METOPROLOL SUCCINATE ER 100 MG PO TB24: ORAL_TABLET | ORAL | 3 refills | Status: DC

## 2024-08-27 NOTE — Telephone Encounter (Signed)
 Pt c/o medication issue:  1. Name of Medication:    metoprolol  succinate (TOPROL -XL) 100 MG 24 hr tablet   2. How are you currently taking this medication (dosage and times per day)?   As prescribed  3. Are you having a reaction (difficulty breathing--STAT)?   4. What is your medication issue?   Patient wants a call back to discuss reducing the dosage of this medication.

## 2024-08-27 NOTE — Telephone Encounter (Signed)
 Spoke to patient he stated he needs a refill on Metoprolol .Refill sent to pharmacy.Appointment scheduled 11/18 at 3:20 pm with Dr.Chandrasekhar.

## 2024-08-27 NOTE — Telephone Encounter (Signed)
*  STAT* If patient is at the pharmacy, call can be transferred to refill team.   1. Which medications need to be refilled? (please list name of each medication and dose if known)   metoprolol  succinate (TOPROL -XL) 100 MG 24 hr tablet   2. Would you like to learn more about the convenience, safety, & potential cost savings by using the [facility]?   3. Are you open to using the [facility] (Type [facility]. ).  4. Which pharmacy/location (including street and city if local pharmacy) is medication to be sent to?  [id number] [place], [place] - [address]   5. Do they need a 30 day or 90 day supply?   30 day  Patient stated he only has 1 tablet left.  Patient has appointment scheduled with Dr. Santo on 1/19.

## 2024-08-29 ENCOUNTER — Ambulatory Visit: Payer: Self-pay | Admitting: Student in an Organized Health Care Education/Training Program

## 2024-08-30 NOTE — Telephone Encounter (Signed)
 Refill sent 11/07/23

## 2024-09-02 ENCOUNTER — Other Ambulatory Visit: Payer: Self-pay | Admitting: Internal Medicine

## 2024-09-02 DIAGNOSIS — I421 Obstructive hypertrophic cardiomyopathy: Secondary | ICD-10-CM

## 2024-09-02 NOTE — Telephone Encounter (Signed)
*  STAT* If patient is at the pharmacy, call can be transferred to refill team.   1. Which medications need to be refilled? (please list name of each medication and dose if known)   mavacamten  (CAMZYOS ) 5 MG CAPS capsule   2. Would you like to learn more about the convenience, safety, & potential cost savings by using the [facility]?   3. Are you open to using the [facility] (Type [facility]. ).  4. Which pharmacy/location (including street and city if local pharmacy) is medication to be sent to?  [facility], [place] - [address]   5. Do they need a 30 day or 90 day supply?   Caller Ona) stated they will need a new prescription for patient's medication.

## 2024-09-03 ENCOUNTER — Telehealth: Payer: Self-pay | Admitting: Internal Medicine

## 2024-09-03 NOTE — Telephone Encounter (Signed)
*  STAT* If patient is at the pharmacy, call can be transferred to refill team.   1. Which medications need to be refilled? (please list name of each medication and dose if known)   colchicine  0.6 MG tablet    2. Which pharmacy/location (including street and city if local pharmacy) is medication to be sent to? [facility], [place] - [address]   3. Do they need a 30 day or 90 day supply? 90

## 2024-09-03 NOTE — Telephone Encounter (Signed)
 Returned call to pt to verify the request of the Colchicine .  Per pt, he didn't request that, he thinks the Pharmacy hit the wrong medication, he was inquiring about Metoprolol .  He was advised that the Metoprolol  was sent in 08/27/24.

## 2024-09-06 ENCOUNTER — Other Ambulatory Visit: Payer: Self-pay

## 2024-09-06 ENCOUNTER — Other Ambulatory Visit (HOSPITAL_COMMUNITY): Payer: Self-pay

## 2024-09-06 MED ORDER — MAVACAMTEN 5 MG PO CAPS: 5.0000 mg | ORAL_CAPSULE | Freq: Every day | ORAL | 2 refills | Status: DC

## 2024-09-06 MED ORDER — MAVACAMTEN 5 MG PO CAPS
5.0000 mg | ORAL_CAPSULE | Freq: Every day | ORAL | 2 refills | Status: DC
  Filled 2024-09-06: qty 35, 35d supply, fill #0

## 2024-09-06 NOTE — Addendum Note (Signed)
 Addended by: DARRELL BRUCKNER on: 09/06/2024 01:37 PM   Modules accepted: Orders

## 2024-09-06 NOTE — Telephone Encounter (Signed)
 Received notice from pharmacy that [facility]

## 2024-09-07 ENCOUNTER — Ambulatory Visit: Attending: Internal Medicine

## 2024-09-07 ENCOUNTER — Ambulatory Visit: Attending: Internal Medicine | Admitting: Internal Medicine

## 2024-09-07 VITALS — BP 130/80 | HR 71 | Ht 74.0 in | Wt 200.2 lb

## 2024-09-07 DIAGNOSIS — I471 Supraventricular tachycardia, unspecified: Secondary | ICD-10-CM | POA: Diagnosis not present

## 2024-09-07 DIAGNOSIS — Z136 Encounter for screening for cardiovascular disorders: Secondary | ICD-10-CM

## 2024-09-07 MED ORDER — METOPROLOL SUCCINATE ER 50 MG PO TB24: 50.0000 mg | ORAL_TABLET | Freq: Every day | ORAL | 3 refills | Status: DC

## 2024-09-07 NOTE — Progress Notes (Signed)
 Cardiology Office Note:  .    Date:  09/07/2024  ID:  Jake Huff, DOB [birth year], MRN [id number] PCP: Macarthur Jake SQUIBB, DO   [facility] Providers Cardiologist:  Jake DELENA Leavens, MD Electrophysiologist:  Jake Sage, MD (Inactive)     CC: F/u CMI therapy.   History of Present Illness: Jake Huff    Jake Huff is a 48 y.o. male with sarcomeric hypertrophic cardiomyopathy who presents with persistent palpitations and arrhythmias.  He has been experiencing persistent palpitations and arrhythmias since May, with symptoms including mild arrhythmias and occasional shortness of breath. His chest pain and shortness of breath have improved, but palpitations persist. No episodes of syncope, but he mentions feeling presyncopal a couple of times, with the last episode occurring about a month ago.  He has a history of supraventricular tachycardia and non-sustained ventricular tachycardia. He has not experienced atrial fibrillation. His last echocardiogram was in July, and he has another scheduled for December. He has been monitored with heart monitors in the past, which detected ventricular tachycardia.  He currently uses a subcutaneous ICD, which is not due for replacement soon, as it is at 33% capacity.  His daughter has been tested for the MYBC3 gene mutation and is negative, which is relevant to his family history of hypertrophic cardiomyopathy.  He is currently taking metoprolol  at a dose of 100 mg twice a day but experiences fatigue as a side effect. He is concerned about the potential for increased palpitations with a dose reduction.  Relevant histories: .  Social  - sister s/p myectomy and on Aficamten after - Daughter is MYBC3 gene negative ROS: As per HPI.   Studies Reviewed: .     Cardiac Studies & Procedures   ______________________________________________________________________________________________   STRESS TESTS  ECHOCARDIOGRAM STRESS TEST  01/05/2024  Narrative EXERCISE STRESS ECHO REPORT   --------------------------------------------------------------------------------  Patient Name:   Jake Huff Date of Exam: 01/05/2024 Medical Rec #:  [id number]     Height:       74.0 in Accession #:    [id number]    Weight:       204.0 lb Date of Birth:  12/26/1975      BSA:          2.192 m Patient Age:    48 years      BP:           135/88 mmHg Patient Gender: M             HR:           77 bpm. Exam Location:  Jake [facility]  Procedure: Limited Echo, Stress Echo, Cardiac Doppler and Limited Color Doppler  Indications:     I42.2 Hypertrophic cardiomyopathy  History:         Patient has prior history of Echocardiogram examinations, most recent 07/01/23. Family hx of sudden death.  Sonographer:     Jake Huff RCS Sonographer#2:   Jake Huff, RDCS Referring Phys:  [id number] Jake Huff DELENA Huff Diagnosing Phys: Jake Merck MD  IMPRESSIONS   1. Dynamic maneuvers performed to elicit LVOT/intracavitary gradient. At rest, no gradient is noted (HR 73 bpm). 2. The findings of the study demonstrate that a stress-induced outflow tract obstruction is present (peak LVOT gradient 51 mmHG). 3. With Valsalva, max instantaneous intracavitary gradient is 40 mmHg and max instantaneous LVOT gradient is 49 mmHg (HR 83 bpm). Mild MR. 4. With repetitive squat to stand, max instantaneous LVOT gradient is 42 mmHg (HR 84 bpm). Exercise stress  was indeterminate for gradient provocation (HR 98 bpm). Post exercise Valsalva demonstrates max instantaneous gradient of 51 mmHg (HR 83 bpm). 5. Baseline echo findings: LVEF 65-70%, basal-mid septal hypertrophy 14 mm. Normal RV size and function. Mild SAM, trivial MR, grade 1 diastolic dysfunction, trivial TR. 6. This is an inconclusive stress echocardiogram for ischemia. Non-diagnostic study for ischemia due to protocol. 7. This is an indeterminate risk study for ischemia due to  protocol.  FINDINGS  Exam Protocol: The patient exercised on a treadmill according to a Bruce protocol. Squat to Stand Provocation [facility]) Protocol + exercise stress.   Patient Performance: The patient exercised for 4 minutes and 31 seconds, achieving 7 METS. The baseline heart rate was 71 bpm. The heart rate at peak stress was 134 bpm. The target heart rate was calculated to be 146 bpm. The percentage of maximum predicted heart rate achieved was 78.0 %. The baseline blood pressure was 135/88 mmHg. The blood pressure at peak stress was 182/120 mmHg. The blood pressure response was normal. The patient developed fatigue during the stress exam. The symptoms resolved with rest. The patient's functional capacity was below average.  EKG: Resting EKG showed normal sinus rhythm with nonspecific ST-T wave changes and T wave abnormality. The patient developed baseline abnormalities during exercise. The stress EKG was not interpretable due to baseline abnormalities.   2D Echo Findings: The baseline ejection fraction was 70%. The peak ejection fraction at stress was 75%. Baseline regional wall motion abnormalities were not present. This is an inconclusive stress echocardiogram for ischemia. This is a non-diagnostic study for ischemia due to protocol.  Stress Doppler:  LVOT: The peak LVOT gradient at stress was 51 mmHG. The findings of the study demonstrate that a stress-induced outflow tract obstruction is present.   Jake Merck MD Electronically signed on 01/05/2024 at 9:17:56 PM      Final   ECHOCARDIOGRAM  ECHOCARDIOGRAM LIMITED 04/29/2024  Narrative ECHOCARDIOGRAM LIMITED REPORT    Patient Name:   Jake Huff Date of Exam: 04/28/2024 Medical Rec #:  [id number]     Height:       74.0 in Accession #:    [id number]    Weight:       205.0 lb Date of Birth:  06/22/1976      BSA:          2.196 m Patient Age:    48 years      BP:           136/83 mmHg Patient Gender: M             HR:            75 bpm. Exam Location:  Jake Huff  Procedure: 2D Echo, 3D Echo, Cardiac Doppler, Color Doppler and Strain Analysis (Both Spectral and Color Flow Doppler were utilized during procedure).  Indications:     I42.1 HOCM  History:         Patient has prior history of Echocardiogram examinations, most recent 03/31/2024. ICD, Arrythmias:NSVT, Signs/Symptoms:Palpitations; Risk Factors:Family Hx of HOCM.  Sonographer:     Jake Huff RCS Sonographer#2:   Jake Huff, RDCS Referring Phys:  [id number] [facility] DELENA Huff Diagnosing Phys: Jake Leavens MD  IMPRESSIONS   1. Maximal septal thickness 18 mm. Resting LVOT gradient 14 mm Hg; Valsalva 28 mm Hg. Left ventricular ejection fraction, by estimation, is 65 to 70%. Left ventricular ejection fraction by 3D volume is 65 %. The left ventricle has normal function. The left ventricle has  no regional wall motion abnormalities. The average left ventricular global longitudinal strain is -20.0 %. The global longitudinal strain is normal. 2. Right ventricular systolic function is hyperdynamic. 3. The mitral valve is normal in structure. Mild mitral valve regurgitation. 4. The aortic valve was not well visualized.  Comparison(s): Prior images reviewed side by side. LVOT gradient has significantlly decrease; no squat to stand gradient of severity.  FINDINGS Left Ventricle: Maximal septal thickness 18 mm. Resting LVOT gradient 14 mm Hg; Valsalva 28 mm Hg. Left ventricular ejection fraction, by estimation, is 65 to 70%. Left ventricular ejection fraction by 3D volume is 65 %. The left ventricle has normal function. The left ventricle has no regional wall motion abnormalities. The average left ventricular global longitudinal strain is -20.0 %. Strain was performed and the global longitudinal strain is normal.  Right Ventricle: Right ventricular systolic function is hyperdynamic.  Mitral Valve: Mild central regurgitation. The  mitral valve is normal in structure. Mild mitral valve regurgitation.  Aortic Valve: The aortic valve was not well visualized.  Pulmonic Valve: The pulmonic valve was normal in structure. Pulmonic valve regurgitation is not visualized. No evidence of pulmonic stenosis.  Additional Comments: 3D was performed not requiring image post processing on an independent workstation and was normal.  LEFT VENTRICLE PLAX 2D LVIDd:         4.10 cm         Diastology LVIDs:         2.90 cm         LV e' medial:    7.51 cm/s LV PW:         1.40 cm         LV E/e' medial:  8.9 LV IVS:        1.80 cm         LV e' lateral:   8.81 cm/s LV E/e' lateral: 7.6  2D Longitudinal Strain 2D Strain GLS   -24.5 % (A4C): 2D Strain GLS   -23.6 % (A3C): 2D Strain GLS   -12.1 % (A2C): 2D Strain GLS   -20.0 % Avg:  3D Volume EF LV 3D EF:    Left ventricul ar ejection fraction by 3D volume is 65 %.  3D Volume EF: 3D EF:        65 % LV EDV:       108 ml LV ESV:       38 ml LV SV:        70 ml  LEFT ATRIUM         Index LA diam:    4.10 cm 1.87 cm/m MITRAL VALVE MV Area (PHT): MV Decel Time: MV E velocity: 66.60 cm/s MV A velocity: 62.90 cm/s MV E/A ratio:  1.06  Jake Leavens MD Electronically signed by Jake Leavens MD Signature Date/Time: 05/05/2024/5:07:02 PM    Final    MONITORS  LONG TERM MONITOR (3-14 DAYS) 06/26/2023  Narrative Indication:palpitations HCM  Duration: 7d  Findings HR  avg 83  Min 54-Max 149 PVCs Rare, less than 1%  PACs Rare, less than 1%  SVT Nonsustained 1 episodes; fastest 187 bpm for 8 beats   ; longest for same VT Nonsustained 1 episodes; fastest 190 bpm for 11 beats; longest for same   Triggered: with SVT and VT   Conclusions: Symptomatic SVT and VT-- both nonsustained   Recommendations Continue betablockers   CT SCANS  CT CARDIAC SCORING (SELF PAY ONLY) 06/06/2023  Addendum 06/07/2023  3:16 PM ADDENDUM REPORT:  06/07/2023 15:14  EXAM: OVER-READ INTERPRETATION  CT CHEST  The following report is an over-read performed by radiologist Dr. Andrea Gasman of [facility] on 06/07/2023. This over-read does not include interpretation of cardiac or coronary anatomy or pathology. The coronary calcium score interpretation by the cardiologist is attached.  COMPARISON:  Chest CT 05/09/2018  FINDINGS: Vascular: No aortic atherosclerosis. The included aorta is normal in caliber.  Mediastinum/nodes: No adenopathy or mass. Unremarkable esophagus.  Lungs: No focal airspace disease. Calcified nodule in the left upper lobe series 5, image 22, consistent with prior granulomatous disease, benign needing no further imaging follow-up. No pleural fluid. The included airways are patent.  Upper abdomen: No acute or unexpected findings.  Musculoskeletal: There are no acute or suspicious osseous abnormalities. Subcutaneous pacemaker tip in the anterior chest wall soft tissues.  IMPRESSION: No acute or unexpected extracardiac findings.   Electronically Signed By: Andrea Gasman M.D. On: 06/07/2023 15:14  Narrative CLINICAL DATA:  Risk stratification  EXAM: Coronary Calcium Score  TECHNIQUE: The patient was scanned on a Siemens Somatom 64 slice scanner. Axial non-contrast 3mm slices were carried out through the heart. The data set was analyzed on a dedicated work station and scored using the Agatson method.  FINDINGS: Non-cardiac: No significant non cardiac findings on limited lung and soft tissue windows. See separate report from [facility].  Ascending Aorta: Normal diameter 3.4 cm  Pericardium: Normal  Coronary arteries: No calcium noted  IMPRESSION: Coronary calcium score of 0.  Maude Emmer  Electronically Signed: By: Maude Emmer M.D. On: 06/06/2023 16:03   CARDIAC MRI  MR CARDIAC MORPHOLOGY W WO CONTRAST 08/10/2018  Narrative CLINICAL DATA:   Hypertrophic Cardiomyopathy  EXAM: CARDIAC MRI  TECHNIQUE: The patient was scanned on a 1.5 Tesla Siemens magnet. A dedicated cardiac coil was used. Functional imaging was done using Fiesta sequences. 2,3, and 4 chamber views were done to assess for RWMA's. Modified Simpson's rule using a short axis stack was used to calculate an ejection fraction on a dedicated work [employer]. The patient received Gadavist  . After 10 minutes inversion recovery sequences were used to assess for infiltration and scar tissue.  CONTRAST:  Gavavist  FINDINGS: Atria were of normal size. RV was normal in size and function. AV tri-leaflet and normal. Aortic root normal. No ASD/PFO/VSD. Anterior leaflet of mitral valve mildly thickened No SAM or significant MR noted  There was focal basal septal hypertrophy of 14 mm compared to the posterior wall which was only 5-6 mm. Quantitative EF was 62%. Delayed enhancement images showed diffuse uptake especially in the septum and inferior wall. Attempts at T1 mapping and ECV calculation made. ECV in septum appeared abnormal compared to the rest of the ventricle at 60 cc. And T1 value not diagnostic of abnormality at 1040  IMPRESSION: 1.Abnormal basal septal thickening 14 mm with delayed inversion recovery sequences showing diffuse uptake worse in the septum and inferior wall consistent with infiltrative cardiomyopathy such as hypertrophic cardiomyopathy Suggestion of abnormal ECV in the septum as well T1 map not diagnostic  2.Normal LV EF 62%  3.No SAM or LVOT obstruction noted Some flow acceleration around sigmoid septum Some apical displacement of papillary muscles that are hypertrophied  4.No significant MR notes  5.  No pericardial effusion  Maude Emmer   Electronically Signed By: Maude Emmer M.D. On: 08/10/2018 17:45   ______________________________________________________________________________________________         Physical Exam:    VS:  BP 130/80  Pulse 71   Ht 6' 2 (1.88 m)   Wt 200 lb 3.2 oz (90.8 kg)   SpO2 99%   BMI 25.70 kg/m    Wt Readings from Last 3 Encounters:  09/07/24 200 lb 3.2 oz (90.8 kg)  02/25/24 205 lb (93 kg)  11/17/23 204 lb (92.5 kg)    Gen: no distress  Neck: No JVD Cardiac: No Rubs or Gallops, Standing, Valsalva systolic Murmur, RRR +2 radial pulses Respiratory: Clear to auscultation bilaterally, normal effort, normal  respiratory rate GI: Soft, nontender, non-distended  MS: No  edema;  moves all extremities Integument: Skin feels warm Neuro:  At time of evaluation, alert and oriented to person/place/time/situation  Psych: Normal affect, patient feels ok     ASSESSMENT AND PLAN: .    Hypertrophic Cardiomyopathy - Obstructive with stress < 20 mm Hg on CMI therapy  - suspicion of Fabry's/Danon/Noonan's or other mimics of HCM: low - Gene variant: MYBC3 - NYHA II  - Non HCM Contributors to disease/status Palpitations and exertional symptoms (shortness of breath, fatigue) in the context of obstructive hypertrophic cardiomyopathy Palpitations and exertional symptoms, including shortness of breath and fatigue, persist but have improved. Symptoms may be related to obstructive physiology or arrhythmias. Current management includes metoprolol , with a plan to reduce the dose to assess impact on fatigue and obstructive symptoms. Shared decision-making regarding treatment options, with a preference to avoid myectomy due to current symptom control and personal preference against open-heart surgery. - Reduce metoprolol  to 50 mg BID starting December 15th, 2025, to assess impact on fatigue and obstructive symptoms. - Will schedule echocardiogram on December 22nd, 2025, to evaluate obstructive physiology after metoprolol  dose reduction. - Will consider increasing Mavacamten  dose if obstructive physiology worsens after metoprolol  reduction.  Subcutaneous implantable  cardioverter-defibrillator (ICD) management and monitoring Subcutaneous ICD in place, functioning well. Device interrogation reports are satisfactory. ICD may not detect atrial fibrillation due to its subcutaneous nature.  - Will coordinate with Dr. Almetta for further management of ventricular tachycardia if detected.  Atrial fibrillation Assessment  - HCM-AF score 19  History of SVT ablation will get one week non live ziopatch after this. Monitoring for arrhythmias is ongoing, with a plan to assess for atrial fibrillation, flutter, or sustained ventricular tachycardia. - Will repeat heart monitor to assess for atrial fibrillation, flutter, or sustained ventricular tachycardia.   Medication symptom plan - Obstructive hypertrophic cardiomyopathy with symptomatic arrhythmias (supraventricular tachycardia and non-sustained ventricular tachycardia) Symptoms have improved since May, with reduced chest pain and shortness of breath, but palpitations persist. Moderate risk for atrial fibrillation due to hypertrophic cardiomyopathy and gradient. Current management includes metoprolol , with consideration of reducing the dose to address fatigue. Shared decision-making regarding treatment options, including myectomy, alcohol septal ablation, and cardiac myosin inhibitors, with a preference to avoid myectomy due to current symptom control and personal preference against open-heart surgery. Myectomy is not deemed necessary at this time as current guidelines do not mandate it for NOHA class II symptoms. - Will repeat heart monitor to assess for atrial fibrillation, flutter, or sustained ventricular tachycardia. - Will reduce metoprolol  to 50 mg BID starting December 15th, 2025, to assess impact on symptoms and obstructive physiology. - Will schedule echocardiogram on December 22nd, 2025, to evaluate obstructive physiology after metoprolol  dose reduction. - Will consider increasing Mavacamten  dose if obstructive  physiology worsens after metoprolol  reduction. - Will discuss potential ablation for atrial fibrillation or flutter if detected. - Will coordinate with Dr. Almetta for further management of ventricular  tachycardia if detected.  F/u with me 16 weeks post echo  Time:   I have spent a total of 49 minutes with the patient reviewing notes, imaging, EKGs, labs, and examining the patient as well as establishing an assessment and plan that was discussed personally with the patient. Discussed disease state education, using shared decision making tools and cardiac modeling , and consideration of SRT. Reviewed care and plan in collaboration with research and with imaging.   Jake Leavens, MD [facility] Cardiologist [facility]  [address] [place], [place] [address] [phone]  5:44 PM

## 2024-09-07 NOTE — Progress Notes (Unsigned)
 Enrolled for [employer] to mail a ZIO XT long term holter monitor to the patients address on file.

## 2024-09-07 NOTE — Patient Instructions (Signed)
 Medication Instructions:  Your physician has recommended you make the following change in your medication:  DECREASE: Metoprolol  Succinate 50 mg twice daily *If you need a refill on your cardiac medications before your next appointment, please call your pharmacy*   Testing/Procedures: ZIO XT- Long Term Monitor Instructions  Your physician has requested you wear a ZIO patch monitor for 7 days.  This is a single patch monitor. [employer] supplies one patch monitor per enrollment. Additional stickers are not available. Please do not apply patch if you will be having a Nuclear Stress Test,  Echocardiogram, Cardiac CT, MRI, or Chest Xray during the period you would be wearing the  monitor. The patch cannot be worn during these tests. You cannot remove and re-apply the  ZIO XT patch monitor.  Your ZIO patch monitor will be mailed 3 day USPS to your address on file. It may take 3-5 days  to receive your monitor after you have been enrolled.  Once you have received your monitor, please review the enclosed instructions. Your monitor  has already been registered assigning a specific monitor serial # to you.  Billing and Patient Assistance Program Information  We have supplied [employer] with any of your insurance information on file for billing purposes. [employer] offers a sliding scale Patient Assistance Program for patients that do not have  insurance, or whose insurance does not completely cover the cost of the ZIO monitor.  You must apply for the Patient Assistance Program to qualify for this discounted rate.  To apply, please call [employer] at [phone], select option 4, select option 2, ask to apply for  Patient Assistance Program. Meredeth will ask your household income, and how many people  are in your household. They will quote your out-of-pocket cost based on that information.  [employer] will also be able to set up a 5-month, interest-free payment plan if needed.  Applying the monitor   Shave  hair from upper left chest.  Hold abrader disc by orange tab. Rub abrader in 40 strokes over the upper left chest as  indicated in your monitor instructions.  Clean area with 4 enclosed alcohol pads. Let dry.  Apply patch as indicated in monitor instructions. Patch will be placed under collarbone on left  side of chest with arrow pointing upward.  Rub patch adhesive wings for 2 minutes. Remove white label marked 1. Remove the white  label marked 2. Rub patch adhesive wings for 2 additional minutes.  While looking in a mirror, press and release button in center of patch. A small green light will  flash 3-4 times. This will be your only indicator that the monitor has been turned on.  Do not shower for the first 24 hours. You may shower after the first 24 hours.  Press the button if you feel a symptom. You will hear a small click. Record Date, Time and  Symptom in the Patient Logbook.  When you are ready to remove the patch, follow instructions on the last 2 pages of Patient  Logbook. Stick patch monitor onto the last page of Patient Logbook.  Place Patient Logbook in the blue and white box. Use locking tab on box and tape box closed  securely. The blue and white box has prepaid postage on it. Please place it in the mailbox as  soon as possible. Your physician should have your test results approximately 7 days after the  monitor has been mailed back to [facility].  Call [facility] Customer Care at [phone] if you  have questions regarding  your ZIO XT patch monitor. Call them immediately if you see an orange light blinking on your  monitor.  If your monitor falls off in less than 4 days, contact our Monitor department at [phone].  If your monitor becomes loose or falls off after 4 days call [employer] at [phone] for  suggestions on securing your monitor   Follow-Up: At [facility], you and your health needs are our priority.  As part of our continuing  mission to provide you with exceptional heart care, our providers are all part of one team.  This team includes your primary Cardiologist (physician) and Advanced Practice Providers or APPs (Physician Assistants and Nurse Practitioners) who all work together to provide you with the care you need, when you need it.  Your next appointment:    16 weeks after 12//22/25  Provider:   Stanly DELENA Leavens, MD

## 2024-10-07 DIAGNOSIS — I471 Supraventricular tachycardia, unspecified: Secondary | ICD-10-CM | POA: Diagnosis not present

## 2024-10-08 ENCOUNTER — Ambulatory Visit: Payer: Self-pay | Admitting: Internal Medicine

## 2024-10-08 DIAGNOSIS — I471 Supraventricular tachycardia, unspecified: Secondary | ICD-10-CM

## 2024-10-11 ENCOUNTER — Ambulatory Visit (HOSPITAL_COMMUNITY)
Admission: RE | Admit: 2024-10-11 | Discharge: 2024-10-11 | Disposition: A | Discharge status: Home / Self Care | Source: Ambulatory Visit | Attending: Internal Medicine | Admitting: Internal Medicine

## 2024-10-11 ENCOUNTER — Other Ambulatory Visit: Payer: Self-pay | Admitting: Internal Medicine

## 2024-10-11 DIAGNOSIS — I421 Obstructive hypertrophic cardiomyopathy: Secondary | ICD-10-CM | POA: Insufficient documentation

## 2024-10-13 LAB — ECHOCARDIOGRAM SQUAT TO STAND
Area-P 1/2: 4.36 cm2
S' Lateral: 2.5 cm

## 2024-10-18 ENCOUNTER — Telehealth: Payer: Self-pay | Admitting: Internal Medicine

## 2024-10-18 DIAGNOSIS — I421 Obstructive hypertrophic cardiomyopathy: Secondary | ICD-10-CM

## 2024-10-18 MED ORDER — MAVACAMTEN 5 MG PO CAPS: 5.0000 mg | ORAL_CAPSULE | Freq: Every day | ORAL | 5 refills | Status: AC

## 2024-10-18 NOTE — Addendum Note (Signed)
 Addended by: RANDY HAMP SAILOR on: 10/18/2024 02:52 PM   Modules accepted: Orders

## 2024-10-18 NOTE — Addendum Note (Signed)
 Addended by: RANDY HAMP SAILOR on: 10/18/2024 11:27 AM   Modules accepted: Orders

## 2024-10-18 NOTE — Telephone Encounter (Signed)
 PSF updated on Camzy[employer]  REMS portal.  Refill of camzyos  5 mg sent to specialty pharmacy on file.

## 2024-10-18 NOTE — Telephone Encounter (Signed)
 Patient notes that he is doing OK.   Since last visit notes occasional episodes . There are no interval hospital/ED visit.   No new changes in medications  No chest pain or pressure .  No SOB/DOE and no PND/Orthopnea.  No weight gain or leg swelling.  No palpitations or syncope . Doesn't feel much different with decreasing metoprolol .   LVOT gradient 24 mm HG LVEF 65%  Will Continue Mavacamten  5 mg Continue metoprolol  at current dose  We discussed the grey zone of between 20 and 30 mm Hg dose next echo will be Stress Echo; if LVOT gradient about 30 we will increase to 10 mg and may drop AV nodal therapy further  Also discussed ERI  Stanly Leavens, MD FASE [facility] Cardiologist [facility]  [address] [place], [place] [address] [phone]  10:18 AM

## 2024-11-05 ENCOUNTER — Ambulatory Visit: Payer: Self-pay | Attending: Cardiology | Admitting: Internal Medicine

## 2024-11-05 ENCOUNTER — Other Ambulatory Visit (HOSPITAL_COMMUNITY): Payer: Self-pay

## 2024-11-05 VITALS — BP 112/76 | HR 78 | Ht 74.0 in | Wt 201.3 lb

## 2024-11-05 DIAGNOSIS — I4729 Other ventricular tachycardia: Secondary | ICD-10-CM | POA: Diagnosis not present

## 2024-11-05 DIAGNOSIS — I471 Supraventricular tachycardia, unspecified: Secondary | ICD-10-CM

## 2024-11-05 DIAGNOSIS — I421 Obstructive hypertrophic cardiomyopathy: Secondary | ICD-10-CM | POA: Diagnosis not present

## 2024-11-05 MED ORDER — METOPROLOL SUCCINATE ER 50 MG PO TB24
ORAL_TABLET | ORAL | 3 refills | Status: AC
  Filled 2024-11-05: qty 90, 83d supply, fill #0

## 2024-11-05 NOTE — Progress Notes (Signed)
 " Cardiology Office Note:  .    Date:  11/05/2024  ID:  Jake Huff, DOB [birth year], MRN [id number] PCP: Macarthur Elouise SQUIBB, DO  [facility] Providers Cardiologist:  Stanly DELENA Leavens, MD Electrophysiologist:  Elspeth Sage, MD (Inactive)     CC: F/u CMI therapy.   History of Present Illness: [name]    Jake Huff is a 49 y.o. male with sarcomeric hypertrophic cardiomyopathy who presents for follow-up regarding his condition and medication management.  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 He reports significant improvement in symptoms, noting he can now eat a big meal and walk up a hill without experiencing the previous 'squeezing' in his chest. However, he still experiences occasional mild arrhythmia symptoms, including fluttering and lightheadedness, but no heart racing.  He has been on metoprolol  for approximately ten years, currently at a dose of 100 mg. He feels slightly better energy-wise after reducing the dose from 200 mg to 100 mg, although he still experiences fatigue. He recalls experiencing 'miserable side effects' when he first started metoprolol  at 25 mg, which improved slightly over time. His recent heart monitor did not show any supraventricular tachycardia, atrial fibrillation, atrial flutter, or ventricular tachycardia. He has not experienced any worsening of arrhythmia symptoms with the reduction in metoprolol  dosage.  Relevant histories: .  Social  - sister s/p myectomy and on Aficamten  - Daughter is MYBPC3 gene negative ROS: As per HPI.   Studies Reviewed: .     Cardiac Studies & Procedures   ______________________________________________________________________________________________   STRESS TESTS  ECHOCARDIOGRAM STRESS TEST 01/05/2024  Narrative EXERCISE STRESS ECHO  REPORT   --------------------------------------------------------------------------------  Patient Name:   Jake Huff Date of Exam: 01/05/2024 Medical Rec #:  [id number]     Height:       74.0 in Accession #:    [id number]    Weight:       204.0 lb Date of Birth:  29-Jun-1976      BSA:          2.192 m Patient Age:    48 years      BP:           135/88 mmHg Patient Gender: M             HR:           77 bpm. Exam Location:  [address]  Procedure: Limited Echo, Stress Echo, Cardiac Doppler and Limited Color Doppler  Indications:     I42.2 Hypertrophic cardiomyopathy  History:         Patient has prior history of Echocardiogram examinations, most recent 2023/07/07. Family hx of sudden death.  Sonographer:     Waldo Guadalajara RCS Sonographer#2:   Nolon Vicenta SOLES, RDCS Referring Phys:  [id number] [facility] DELENA LEAVENS Diagnosing Phys: Soyla Merck MD  IMPRESSIONS   1. Dynamic maneuvers performed to elicit LVOT/intracavitary gradient. At rest, no gradient is noted (HR 73 bpm). 2. The findings of the study demonstrate that a stress-induced outflow tract obstruction is present (peak LVOT gradient 51 mmHG). 3. With Valsalva, max instantaneous intracavitary gradient is 40 mmHg and max instantaneous LVOT gradient is 49 mmHg (HR 83 bpm). Mild MR. 4. With repetitive squat to stand, max instantaneous LVOT gradient is 42 mmHg (HR 84 bpm). Exercise stress was indeterminate for gradient provocation (HR 98 bpm). Post exercise Valsalva demonstrates max instantaneous gradient of 51 mmHg (HR 83 bpm). 5. Baseline echo  findings: LVEF 65-70%, basal-mid septal hypertrophy 14 mm. Normal RV size and function. Mild SAM, trivial MR, grade 1 diastolic dysfunction, trivial TR. 6. This is an inconclusive stress echocardiogram for ischemia. Non-diagnostic study for ischemia due to protocol. 7. This is an indeterminate risk study for ischemia due to protocol.  FINDINGS  Exam Protocol: The patient exercised on  a treadmill according to a Bruce protocol. Squat to Stand Provocation [facility]) Protocol + exercise stress.   Patient Performance: The patient exercised for 4 minutes and 31 seconds, achieving 7 METS. The baseline heart rate was 71 bpm. The heart rate at peak stress was 134 bpm. The target heart rate was calculated to be 146 bpm. The percentage of maximum predicted heart rate achieved was 78.0 %. The baseline blood pressure was 135/88 mmHg. The blood pressure at peak stress was 182/120 mmHg. The blood pressure response was normal. The patient developed fatigue during the stress exam. The symptoms resolved with rest. The patient's functional capacity was below average.  EKG: Resting EKG showed normal sinus rhythm with nonspecific ST-T wave changes and T wave abnormality. The patient developed baseline abnormalities during exercise. The stress EKG was not interpretable due to baseline abnormalities.   2D Echo Findings: The baseline ejection fraction was 70%. The peak ejection fraction at stress was 75%. Baseline regional wall motion abnormalities were not present. This is an inconclusive stress echocardiogram for ischemia. This is a non-diagnostic study for ischemia due to protocol.  Stress Doppler:  LVOT: The peak LVOT gradient at stress was 51 mmHG. The findings of the study demonstrate that a stress-induced outflow tract obstruction is present.   Soyla Merck MD Electronically signed on 01/05/2024 at 9:17:56 PM      Final   ECHOCARDIOGRAM  ECHOCARDIOGRAM LIMITED 04/29/2024  Narrative ECHOCARDIOGRAM LIMITED REPORT    Patient Name:   Jake Huff Date of Exam: 04/28/2024 Medical Rec #:  [id number]     Height:       74.0 in Accession #:    [id number]    Weight:       205.0 lb Date of Birth:  September 21, 1976      BSA:          2.196 m Patient Age:    48 years      BP:           136/83 mmHg Patient Gender: M             HR:           75 bpm. Exam Location:  [address]  Procedure: 2D  Echo, 3D Echo, Cardiac Doppler, Color Doppler and Strain Analysis (Both Spectral and Color Flow Doppler were utilized during procedure).  Indications:     I42.1 HOCM  History:         Patient has prior history of Echocardiogram examinations, most recent 03/31/2024. ICD, Arrythmias:NSVT, Signs/Symptoms:Palpitations; Risk Factors:Family Hx of HOCM.  Sonographer:     Waldo Guadalajara RCS Sonographer#2:   Nolon Vicenta SOLES, RDCS Referring Phys:  [id number] [facility] DELENA LEAVENS Diagnosing Phys: Stanly Leavens MD  IMPRESSIONS   1. Maximal septal thickness 18 mm. Resting LVOT gradient 14 mm Hg; Valsalva 28 mm Hg. Left ventricular ejection fraction, by estimation, is 65 to 70%. Left ventricular ejection fraction by 3D volume is 65 %. The left ventricle has normal function. The left ventricle has no regional wall motion abnormalities. The average left ventricular global longitudinal strain is -20.0 %. The global longitudinal strain is normal. 2. Right ventricular  systolic function is hyperdynamic. 3. The mitral valve is normal in structure. Mild mitral valve regurgitation. 4. The aortic valve was not well visualized.  Comparison(s): Prior images reviewed side by side. LVOT gradient has significantlly decrease; no squat to stand gradient of severity.  FINDINGS Left Ventricle: Maximal septal thickness 18 mm. Resting LVOT gradient 14 mm Hg; Valsalva 28 mm Hg. Left ventricular ejection fraction, by estimation, is 65 to 70%. Left ventricular ejection fraction by 3D volume is 65 %. The left ventricle has normal function. The left ventricle has no regional wall motion abnormalities. The average left ventricular global longitudinal strain is -20.0 %. Strain was performed and the global longitudinal strain is normal.  Right Ventricle: Right ventricular systolic function is hyperdynamic.  Mitral Valve: Mild central regurgitation. The mitral valve is normal in structure. Mild mitral valve  regurgitation.  Aortic Valve: The aortic valve was not well visualized.  Pulmonic Valve: The pulmonic valve was normal in structure. Pulmonic valve regurgitation is not visualized. No evidence of pulmonic stenosis.  Additional Comments: 3D was performed not requiring image post processing on an independent workstation and was normal.  LEFT VENTRICLE PLAX 2D LVIDd:         4.10 cm         Diastology LVIDs:         2.90 cm         LV e' medial:    7.51 cm/s LV PW:         1.40 cm         LV E/e' medial:  8.9 LV IVS:        1.80 cm         LV e' lateral:   8.81 cm/s LV E/e' lateral: 7.6  2D Longitudinal Strain 2D Strain GLS   -24.5 % (A4C): 2D Strain GLS   -23.6 % (A3C): 2D Strain GLS   -12.1 % (A2C): 2D Strain GLS   -20.0 % Avg:  3D Volume EF LV 3D EF:    Left ventricul ar ejection fraction by 3D volume is 65 %.  3D Volume EF: 3D EF:        65 % LV EDV:       108 ml LV ESV:       38 ml LV SV:        70 ml  LEFT ATRIUM         Index LA diam:    4.10 cm 1.87 cm/m MITRAL VALVE MV Area (PHT): MV Decel Time: MV E velocity: 66.60 cm/s MV A velocity: 62.90 cm/s MV E/A ratio:  1.06  Stanly Leavens MD Electronically signed by Stanly Leavens MD Signature Date/Time: 05/05/2024/5:07:02 PM    Final    MONITORS  LONG TERM MONITOR (3-14 DAYS) 10/07/2024  Narrative   Patient had a minimum heart rate of 62 bpm, maximum heart rate of 164 bpm, and average heart rate of 88 bpm. Predominant underlying rhythm was sinus rhythm. Isolated PACs were rare (<1.0%). Isolated PVCs were rare (<1.0%). No evidence of significant heart block. Triggered and diary events associated with sinus rhythm or sinus tachycardia.  No malignant arrhythmias of HCM   CT SCANS  CT CARDIAC SCORING (SELF PAY ONLY) 06/06/2023  Addendum 06/07/2023  3:16 PM ADDENDUM REPORT: 06/07/2023 15:14  EXAM: OVER-READ INTERPRETATION  CT CHEST  The following report is an over-read  performed by radiologist Dr. Andrea Gasman of [facility] on 06/07/2023. This over-read does not include interpretation of cardiac or  coronary anatomy or pathology. The coronary calcium score interpretation by the cardiologist is attached.  COMPARISON:  Chest CT 05/09/2018  FINDINGS: Vascular: No aortic atherosclerosis. The included aorta is normal in caliber.  Mediastinum/nodes: No adenopathy or mass. Unremarkable esophagus.  Lungs: No focal airspace disease. Calcified nodule in the left upper lobe series 5, image 22, consistent with prior granulomatous disease, benign needing no further imaging follow-up. No pleural fluid. The included airways are patent.  Upper abdomen: No acute or unexpected findings.  Musculoskeletal: There are no acute or suspicious osseous abnormalities. Subcutaneous pacemaker tip in the anterior chest wall soft tissues.  IMPRESSION: No acute or unexpected extracardiac findings.   Electronically Signed By: Andrea Gasman M.D. On: 06/07/2023 15:14  Narrative CLINICAL DATA:  Risk stratification  EXAM: Coronary Calcium Score  TECHNIQUE: The patient was scanned on a Siemens Somatom 64 slice scanner. Axial non-contrast 3mm slices were carried out through the heart. The data set was analyzed on a dedicated work station and scored using the Agatson method.  FINDINGS: Non-cardiac: No significant non cardiac findings on limited lung and soft tissue windows. See separate report from [facility].  Ascending Aorta: Normal diameter 3.4 cm  Pericardium: Normal  Coronary arteries: No calcium noted  IMPRESSION: Coronary calcium score of 0.  Maude Emmer  Electronically Signed: By: Maude Emmer M.D. On: 06/06/2023 16:03   CARDIAC MRI  MR CARDIAC MORPHOLOGY W WO CONTRAST 08/10/2018  Narrative CLINICAL DATA:  Hypertrophic Cardiomyopathy  EXAM: CARDIAC MRI  TECHNIQUE: The patient was scanned on a 1.5 Tesla  Siemens magnet. A dedicated cardiac coil was used. Functional imaging was done using Fiesta sequences. 2,3, and 4 chamber views were done to assess for RWMA's. Modified Simpson's rule using a short axis stack was used to calculate an ejection fraction on a dedicated work [employer]. The patient received Gadavist  . After 10 minutes inversion recovery sequences were used to assess for infiltration and scar tissue.  CONTRAST:  Gavavist  FINDINGS: Atria were of normal size. RV was normal in size and function. AV tri-leaflet and normal. Aortic root normal. No ASD/PFO/VSD. Anterior leaflet of mitral valve mildly thickened No SAM or significant MR noted  There was focal basal septal hypertrophy of 14 mm compared to the posterior wall which was only 5-6 mm. Quantitative EF was 62%. Delayed enhancement images showed diffuse uptake especially in the septum and inferior wall. Attempts at T1 mapping and ECV calculation made. ECV in septum appeared abnormal compared to the rest of the ventricle at 60 cc. And T1 value not diagnostic of abnormality at 1040  IMPRESSION: 1.Abnormal basal septal thickening 14 mm with delayed inversion recovery sequences showing diffuse uptake worse in the septum and inferior wall consistent with infiltrative cardiomyopathy such as hypertrophic cardiomyopathy Suggestion of abnormal ECV in the septum as well T1 map not diagnostic  2.Normal LV EF 62%  3.No SAM or LVOT obstruction noted Some flow acceleration around sigmoid septum Some apical displacement of papillary muscles that are hypertrophied  4.No significant MR notes  5.  No pericardial effusion  Maude Emmer   Electronically Signed By: Maude Emmer M.D. On: 08/10/2018 17:45   ______________________________________________________________________________________________        Physical Exam:    VS:  There were no vitals taken for this visit.   Wt Readings from Last 3  Encounters:  09/07/24 200 lb 3.2 oz (90.8 kg)  02/25/24 205 lb (93 kg)  11/17/23 204 lb (92.5 kg)  Gen: no distress  Neck: No JVD Cardiac: No Rubs or Gallops, Standing, Valsalva systolic Murmur, RRR +2 radial pulses Respiratory: Clear to auscultation bilaterally, normal effort, normal  respiratory rate GI: Soft, nontender, non-distended  MS: No  edema;  moves all extremities Integument: Skin feels warm Neuro:  At time of evaluation, alert and oriented to person/place/time/situation  Psych: Normal affect, patient feels ok   ASSESSMENT AND PLAN: .    Hypertrophic Cardiomyopathy - Obstructive with stress < 20 mm Hg on CMI therapy  - suspicion of Fabry's/Danon/Noonan's or other mimics of HCM: low - Gene variant: MYBC3 - NYHA II  - Non HCM Contributors to disease/status SVT with prior ablation and hx of NSVT; has resolved, I suspect he is having symptoms form being on metoprolol  with potential break through LVOT gradient  Subcutaneous implantable cardioverter-defibrillator (ICD) management and monitoring Subcutaneous ICD in place, functioning well. Device interrogation reports are satisfactory. ICD may not detect atrial fibrillation due to its subcutaneous nature.  - Will coordinate with Dr. Almetta for further management of ventricular tachycardia if detected.  Atrial fibrillation Assessment  - HCM-AF score 19  History of SVT ablation will get one week non live ziopatch after this. Monitoring for arrhythmias is ongoing, with a plan to assess for atrial fibrillation, flutter, or sustained ventricular tachycardia. - repeat monitor in 2027   Medication symptom plan - MYBPC3 gene mutation, managed with Mavacamten  and metoprolol . LVOT gradient is 28 mmHg, below the threshold for increasing Mavacamten . Symptoms include mild arrhythmia and occasional lightheadedness, without significant supraventricular tachycardia, atrial fibrillation, or ventricular tachycardia on recent heart  monitor. Metoprolol  reduction from 100 mg to 50 mg has improved energy levels. Plan to further reduce metoprolol  to 75 mg for two weeks, then to 50 mg, to assess impact on symptoms and energy levels. Potential increase in Mavacamten  to 10 mg if stress echo shows inducible gradient above 30 mmHg. Discussed benefits of reducing metoprolol  to improve energy and potential increase in Mavacamten  if needed. Emphasized importance of monitoring symptoms and adjusting treatment as necessary. - Reduce metoprolol  to 75 mg for two weeks, then to 50 mg. - Will order stress echocardiogram to assess need for Mavacamten  dose increase. - Will consider increasing Mavacamten  to 10 mg if stress echo shows inducible gradient above 30 mmHg. - Monitor symptoms and adjust metoprolol  dose if symptoms worsen.  F/u with me 16 weeks post echo  Longitudinal care: The evaluation and management services provided today reflect the complexity inherent in caring for this patient, including the ongoing longitudinal relationship and management of multiple chronic conditions and/or the need for care coordination. The visit required a comprehensive assessment and management plan tailored to the patient's unique needs Time was spent addressing not only the acute concerns but also the broader context of the patient's health, including preventive care, chronic disease management, and care coordination as appropriate.  Complex longitudinal is necessary for conditions including: Complex HCM management  Spring f/u with me or Orren Fabry PA-C   Stanly Leavens, MD FASE [facility] Cardiologist [facility]  [address] [place], [address] [place], [place] [address] [phone]  8:06 AM  "

## 2024-11-05 NOTE — Patient Instructions (Signed)
 Medication Instructions:  Your physician has recommended you make the following change in your medication:   DECREASE: metoprolol  succinate to 75 mg by mouth once daily for 2 weeks then DECREASE: to 50 mg by mouth once daily  *If you need a refill on your cardiac medications before your next appointment, please call your pharmacy*  Lab Work: NONE    Testing/Procedures:  LATE MAY - EARLY JUNE- - - -Your physician has requested that you have a stress echocardiogram. For further information please visit [email or web address] Please follow instructions.   Do not eat, drink or use tobacco products 4 hour prior to the test. Dress prepared to exercise in a comfortable, two piece clothing outfit and walking shoes. Do not wear any perfume, cologne, aftershave or lotion. Bring any current prescription medications with you the day of the test. Notify the office 24 hours in advance if you cannot keep this appointment. If you have any questions please call [phone].    Please note: We ask at that you not bring children with you during ultrasound (echo/ vascular) testing. Due to room size and safety concerns, children are not allowed in the ultrasound rooms during exams. Our front office staff cannot provide observation of children in our lobby area while testing is being conducted. An adult accompanying a patient to their appointment will only be allowed in the ultrasound room at the discretion of the ultrasound technician under special circumstances. We apologize for any inconvenience.   Follow-Up:As scheduled  At [facility], you and your health needs are our priority.  As part of our continuing mission to provide you with exceptional heart care, our providers are all part of one team.  This team includes your primary Cardiologist (physician) and Advanced Practice Providers or APPs (Physician Assistants and Nurse Practitioners) who all work together to provide you with the care you need,  when you need it.   Provider:   Stanly Leavens, MD   Other Instructions

## 2024-11-08 ENCOUNTER — Ambulatory Visit: Admitting: Internal Medicine

## 2024-11-08 ENCOUNTER — Telehealth: Payer: Self-pay | Admitting: Internal Medicine

## 2024-11-08 NOTE — Telephone Encounter (Signed)
 Pt c/o medication issue:  1. Name of Medication:   mavacamten  (CAMZYOS ) 5 MG CAPS capsule   2. How are you currently taking this medication (dosage and times per day)? As written  3. Are you having a reaction (difficulty breathing--STAT)? no  4. What is your medication issue?   Pt states medication needs prior auth. Fax: [phone] Phone: [phone]

## 2024-11-09 ENCOUNTER — Telehealth: Payer: Self-pay | Admitting: Internal Medicine

## 2024-11-09 ENCOUNTER — Encounter: Payer: Self-pay | Admitting: Internal Medicine

## 2024-11-09 ENCOUNTER — Other Ambulatory Visit (HOSPITAL_COMMUNITY): Payer: Self-pay

## 2024-11-09 ENCOUNTER — Telehealth: Payer: Self-pay | Admitting: Pharmacy Technician

## 2024-11-09 NOTE — Telephone Encounter (Signed)
" ° °  Pharmacy Patient Advocate Encounter   Received notification from Pt Calls Messages that prior authorization for camzyos  5mg  is required/requested.   Insurance verification completed.   The patient is insured through [employer].   Per test claim: PA required; PA submitted to above mentioned insurance via Latent Key/confirmation #/EOC [id number] Status is pending  "

## 2024-11-09 NOTE — Telephone Encounter (Signed)
 PA request has been Submitted. New Encounter has been or will be created for follow up. For additional info see Pharmacy Prior Auth telephone encounter from 11/09/24.

## 2024-11-09 NOTE — Telephone Encounter (Signed)
 Needing Prior Auth please advise. mavacamten  (CAMZYOS ) 5 MG CAPS capsule

## 2024-11-10 NOTE — Telephone Encounter (Signed)
Pharmacy called to follow up - please advise

## 2024-11-11 ENCOUNTER — Telehealth: Payer: Self-pay | Admitting: Internal Medicine

## 2024-11-11 MED ORDER — MAVACAMTEN 5 MG PO CAPS: 5.0000 mg | ORAL_CAPSULE | Freq: Every day | ORAL | 0 refills | Status: AC

## 2024-11-11 NOTE — Telephone Encounter (Signed)
" °*  STAT* If patient is at the pharmacy, call can be transferred to refill team.   1. Which medications need to be refilled? (please list name of each medication and dose if known)  mavacamten  (CAMZYOS ) 5 MG CAPS capsule     2. Would you like to learn more about the convenience, safety, & potential cost savings by using the [facility]? no   3. Are you open to using the [facility] (Type [facility]. no   4. Which pharmacy/location (including street and city if local pharmacy) is medication to be sent to?  [facility] - [place], [place] - [address]    5. Do they need a 30 day or 90 day supply?   Pt is completely out.   "

## 2024-11-12 NOTE — Telephone Encounter (Signed)
 Called [facility] advised that our office is working on Prior Authorization.  Per Geraldine, [facility] pt approved for free bridge from [employer].

## 2024-11-16 NOTE — Telephone Encounter (Signed)
 I called [facility] and they said they didn't call. Confirmed patient did receive his bridge.

## 2024-11-19 NOTE — Telephone Encounter (Signed)
 Pharmacy calling to f/u please advise.   [phone] ext [phone]

## 2024-11-22 ENCOUNTER — Ambulatory Visit: Payer: Self-pay

## 2024-11-22 NOTE — Telephone Encounter (Signed)
 They are checking on Prior # [phone] [facility]) to see if it was sent.

## 2024-11-24 ENCOUNTER — Encounter: Payer: Self-pay | Admitting: Pharmacist

## 2024-11-24 ENCOUNTER — Other Ambulatory Visit (HOSPITAL_COMMUNITY): Payer: Self-pay

## 2024-11-24 ENCOUNTER — Telehealth: Payer: Self-pay | Admitting: Student in an Organized Health Care Education/Training Program

## 2024-11-24 NOTE — Telephone Encounter (Signed)
 Patient was called regarding missed remote transmission.

## 2024-11-24 NOTE — Telephone Encounter (Signed)
 Pharmacy Patient Advocate Encounter  Received notification from [facility] that Prior Authorization for camzyos  has been APPROVED from 11/24/24 to 02/22/25

## 2024-11-24 NOTE — Telephone Encounter (Signed)
 I called [phone] and they said this is still being reviewed. She is putting in a ticket to be expedited. She has my number to call. She said we should hear from 24-48 hours

## 2024-11-25 NOTE — Telephone Encounter (Signed)
 Spoke to patient and he was made aware of approval and to call [facility] to fill. He still has about 20 days worth from the bridge.

## 2024-12-20 ENCOUNTER — Ambulatory Visit: Admitting: Student in an Organized Health Care Education/Training Program

## 2024-12-20 ENCOUNTER — Ambulatory Visit: Payer: Self-pay | Admitting: Internal Medicine

## 2025-02-21 ENCOUNTER — Ambulatory Visit: Payer: Self-pay

## 2025-03-11 ENCOUNTER — Other Ambulatory Visit (HOSPITAL_COMMUNITY)

## 2025-03-11 ENCOUNTER — Ambulatory Visit (HOSPITAL_COMMUNITY)

## 2025-05-23 ENCOUNTER — Ambulatory Visit: Payer: Self-pay

## 2025-08-22 ENCOUNTER — Ambulatory Visit: Payer: Self-pay

## 2025-11-21 ENCOUNTER — Ambulatory Visit: Payer: Self-pay
